# Patient Record
Sex: Male | Born: 1965
Health system: Southern US, Community
[De-identification: ages and names within clinical notes are randomized; demographics above are authoritative.]

## PROBLEM LIST (undated history)

## (undated) DIAGNOSIS — F1410 Cocaine abuse, uncomplicated: Secondary | ICD-10-CM

## (undated) DIAGNOSIS — C801 Malignant (primary) neoplasm, unspecified: Secondary | ICD-10-CM

## (undated) DIAGNOSIS — M51369 Other intervertebral disc degeneration, lumbar region without mention of lumbar back pain or lower extremity pain: Secondary | ICD-10-CM

## (undated) DIAGNOSIS — M549 Dorsalgia, unspecified: Secondary | ICD-10-CM

## (undated) DIAGNOSIS — I10 Essential (primary) hypertension: Secondary | ICD-10-CM

## (undated) DIAGNOSIS — M5136 Other intervertebral disc degeneration, lumbar region: Secondary | ICD-10-CM

## (undated) HISTORY — PX: KIDNEY SURGERY: SHX687

---

## 1998-09-05 ENCOUNTER — Emergency Department (HOSPITAL_COMMUNITY): Admission: EM | Admit: 1998-09-05 | Discharge: 1998-09-05 | Payer: Self-pay | Admitting: Emergency Medicine

## 1999-10-28 ENCOUNTER — Encounter: Payer: Self-pay | Admitting: Internal Medicine

## 1999-10-28 ENCOUNTER — Emergency Department (HOSPITAL_COMMUNITY): Admission: EM | Admit: 1999-10-28 | Discharge: 1999-10-28 | Payer: Self-pay | Admitting: *Deleted

## 2003-07-17 ENCOUNTER — Encounter: Payer: Self-pay | Admitting: Emergency Medicine

## 2003-07-18 ENCOUNTER — Encounter: Payer: Self-pay | Admitting: Internal Medicine

## 2003-07-18 ENCOUNTER — Inpatient Hospital Stay (HOSPITAL_COMMUNITY): Admission: EM | Admit: 2003-07-18 | Discharge: 2003-07-22 | Payer: Self-pay | Admitting: Emergency Medicine

## 2003-07-19 ENCOUNTER — Encounter: Payer: Self-pay | Admitting: Internal Medicine

## 2003-07-20 ENCOUNTER — Encounter: Payer: Self-pay | Admitting: Internal Medicine

## 2003-07-20 ENCOUNTER — Encounter (INDEPENDENT_AMBULATORY_CARE_PROVIDER_SITE_OTHER): Payer: Self-pay | Admitting: Cardiology

## 2005-01-22 ENCOUNTER — Emergency Department (HOSPITAL_COMMUNITY): Admission: EM | Admit: 2005-01-22 | Discharge: 2005-01-22 | Payer: Self-pay | Admitting: *Deleted

## 2005-01-28 ENCOUNTER — Ambulatory Visit (HOSPITAL_COMMUNITY): Admission: RE | Admit: 2005-01-28 | Discharge: 2005-01-28 | Payer: Self-pay | Admitting: Sports Medicine

## 2005-01-28 ENCOUNTER — Emergency Department (HOSPITAL_COMMUNITY): Admission: EM | Admit: 2005-01-28 | Discharge: 2005-01-28 | Payer: Self-pay | Admitting: Emergency Medicine

## 2005-08-06 ENCOUNTER — Emergency Department (HOSPITAL_COMMUNITY): Admission: EM | Admit: 2005-08-06 | Discharge: 2005-08-06 | Payer: Self-pay | Admitting: Emergency Medicine

## 2005-08-30 ENCOUNTER — Emergency Department (HOSPITAL_COMMUNITY): Admission: EM | Admit: 2005-08-30 | Discharge: 2005-08-30 | Payer: Self-pay | Admitting: Emergency Medicine

## 2005-10-03 ENCOUNTER — Emergency Department (HOSPITAL_COMMUNITY): Admission: EM | Admit: 2005-10-03 | Discharge: 2005-10-03 | Payer: Self-pay | Admitting: Emergency Medicine

## 2005-10-28 ENCOUNTER — Emergency Department (HOSPITAL_COMMUNITY): Admission: EM | Admit: 2005-10-28 | Discharge: 2005-10-28 | Payer: Self-pay | Admitting: Emergency Medicine

## 2006-01-19 ENCOUNTER — Emergency Department (HOSPITAL_COMMUNITY): Admission: EM | Admit: 2006-01-19 | Discharge: 2006-01-19 | Payer: Self-pay | Admitting: *Deleted

## 2006-02-07 ENCOUNTER — Emergency Department (HOSPITAL_COMMUNITY): Admission: EM | Admit: 2006-02-07 | Discharge: 2006-02-07 | Payer: Self-pay | Admitting: Emergency Medicine

## 2006-02-13 ENCOUNTER — Emergency Department (HOSPITAL_COMMUNITY): Admission: EM | Admit: 2006-02-13 | Discharge: 2006-02-13 | Payer: Self-pay | Admitting: Emergency Medicine

## 2006-09-27 ENCOUNTER — Ambulatory Visit: Payer: Self-pay | Admitting: Family Medicine

## 2007-10-03 ENCOUNTER — Emergency Department (HOSPITAL_COMMUNITY): Admission: EM | Admit: 2007-10-03 | Discharge: 2007-10-03 | Payer: Self-pay | Admitting: Emergency Medicine

## 2010-01-11 ENCOUNTER — Emergency Department (HOSPITAL_COMMUNITY): Admission: EM | Admit: 2010-01-11 | Discharge: 2010-01-11 | Payer: Self-pay | Admitting: Emergency Medicine

## 2011-02-10 NOTE — Discharge Summary (Signed)
NAME:  Marvin Mckee, Marvin Mckee                      ACCOUNT NO.:  [id number]   MEDICAL RECORD NO.:  [id number]                   PATIENT TYPE:  INP   LOCATION:  0376                                 FACILITY:  [facility]   PHYSICIAN:  Sherin Quarry, MD                   DATE OF BIRTH:  1966/02/25   DATE OF ADMISSION:  07/17/2003  DATE OF DISCHARGE:                                 DISCHARGE SUMMARY   Marvin Mckee is a 45 year old man with history of cocaine abuse but no  history of intravenous drug use, who presented to the emergency room on  October 22 with diffuse chest discomfort and a cough productive of brownish-  tinged phlegm.  The patient indicated this had been going on for about two  weeks.  He reported moderate pleuritic chest discomfort.  He had no previous  history of any significant medical problems.   Physical exam at the time of admission, as described by Hortencia Pilar, M.D.,  the patient's temperature was 102.3.  The patient was lethargic but  oriented.  HEENT:  Within normal limits. The examination of the chest was  described as clear to auscultation and percussion.  Cardiovascular exam  revealed normal S1 and S2 without rubs, murmurs, or gallops.  The white  count was 25,000.   The BMET was within normal limits.  Liver profile was normal.  Urine  metabolites were positive for both benzodiazepines and cocaine.  Chest x-ray  obtained revealed a subtle right lower lobe pneumonia.  A CT scan of the  head was negative.  Abdominal ultrasound showed evidence of gallbladder  sludge.  On admission, Dr. Soyla Dryer put the patient on oxacillin and  gentamicin, I believe because she was concerned about the possibility that  the patient might have endocarditis.  The patient was seen in consultation  by Lacretia Leigh. Ninetta Lights, M.D. of the infectious diseases service, who assisted  with the patient's management.  When it became clear that the patient was  unlikely to have endocarditis given  the fact that he had no history of  intravenous drug use and also had a normal 2 D echocardiogram, it was felt  appropriate to change his antibiotic therapy to Zithromax 250 mg daily and  Rocephin 1 g IV q.24h.  Dr. Ninetta Lights suggested that it might be prudent to  obtain a CT scan of the chest to rule out pulmonary embolus.  This was done  on October 27, and the patient had only the finding of a pulmonary  infiltrate.  By that time, the patient had received four and a half days of  antibiotics and was afebrile.  It was therefore felt reasonable to discharge  him.   DISCHARGE DIAGNOSES:  1. Right lower lobe pneumonia.  2. Cocaine abuse.   DISCHARGE MEDICATIONS:  1. Zithromax Z-pack taken as directed.  2. Ceftin 250 mg b.i.d. x 5 days.   The patient was  counseled to absolutely refrain from smoking crack cocaine  and also to try as best he could to discontinue cigarette smoking.                                               Sherin Quarry, MD    SY/MEDQ  D:  07/22/2003  T:  07/22/2003  Job:  [id number]

## 2019-02-21 ENCOUNTER — Encounter (HOSPITAL_BASED_OUTPATIENT_CLINIC_OR_DEPARTMENT_OTHER): Payer: Self-pay | Admitting: Emergency Medicine

## 2019-02-21 ENCOUNTER — Emergency Department (HOSPITAL_BASED_OUTPATIENT_CLINIC_OR_DEPARTMENT_OTHER): Payer: Self-pay

## 2019-02-21 ENCOUNTER — Other Ambulatory Visit: Payer: Self-pay

## 2019-02-21 ENCOUNTER — Observation Stay (HOSPITAL_BASED_OUTPATIENT_CLINIC_OR_DEPARTMENT_OTHER)
Admission: EM | Admit: 2019-02-21 | Discharge: 2019-02-23 | Disposition: A | Discharge status: Home / Self Care | Payer: Self-pay | Attending: Internal Medicine | Admitting: Internal Medicine

## 2019-02-21 DIAGNOSIS — Z859 Personal history of malignant neoplasm, unspecified: Secondary | ICD-10-CM | POA: Insufficient documentation

## 2019-02-21 DIAGNOSIS — F1410 Cocaine abuse, uncomplicated: Secondary | ICD-10-CM | POA: Diagnosis present

## 2019-02-21 DIAGNOSIS — Z87891 Personal history of nicotine dependence: Secondary | ICD-10-CM | POA: Insufficient documentation

## 2019-02-21 DIAGNOSIS — I639 Cerebral infarction, unspecified: Secondary | ICD-10-CM

## 2019-02-21 DIAGNOSIS — R55 Syncope and collapse: Principal | ICD-10-CM | POA: Diagnosis present

## 2019-02-21 DIAGNOSIS — I10 Essential (primary) hypertension: Secondary | ICD-10-CM | POA: Diagnosis present

## 2019-02-21 DIAGNOSIS — Z20828 Contact with and (suspected) exposure to other viral communicable diseases: Secondary | ICD-10-CM | POA: Insufficient documentation

## 2019-02-21 HISTORY — DX: Other intervertebral disc degeneration, lumbar region without mention of lumbar back pain or lower extremity pain: M51.369

## 2019-02-21 HISTORY — DX: Essential (primary) hypertension: I10

## 2019-02-21 HISTORY — DX: Dorsalgia, unspecified: M54.9

## 2019-02-21 HISTORY — DX: Cocaine abuse, uncomplicated: F14.10

## 2019-02-21 HISTORY — DX: Other intervertebral disc degeneration, lumbar region: M51.36

## 2019-02-21 HISTORY — DX: Malignant (primary) neoplasm, unspecified: C80.1

## 2019-02-21 LAB — BASIC METABOLIC PANEL
Anion gap: 7 (ref 5–15)
BUN: 16 mg/dL (ref 6–20)
CO2: 26 mmol/L (ref 22–32)
Calcium: 8.4 mg/dL — ABNORMAL LOW (ref 8.9–10.3)
Chloride: 106 mmol/L (ref 98–111)
Creatinine, Ser: 0.9 mg/dL (ref 0.61–1.24)
GFR calc Af Amer: >60 mL/min (ref >60)
GFR calc non Af Amer: >60 mL/min (ref >60)
Glucose, Bld: 109 mg/dL — ABNORMAL HIGH (ref 70–99)
Potassium: 3.5 mmol/L (ref 3.5–5.1)
Sodium: 139 mmol/L (ref 135–145)

## 2019-02-21 LAB — CBC WITH DIFFERENTIAL/PLATELET
Abs Immature Granulocytes: 0.01 10*3/uL (ref 0.00–0.07)
Basophils Absolute: 0.1 10*3/uL (ref 0.0–0.1)
Basophils Relative: 1 %
Eosinophils Absolute: 0.3 10*3/uL (ref 0.0–0.5)
Eosinophils Relative: 5 %
HCT: 41.2 % (ref 39.0–52.0)
Hemoglobin: 13.3 g/dL (ref 13.0–17.0)
Immature Granulocytes: 0 %
Lymphocytes Relative: 45 %
Lymphs Abs: 2.5 10*3/uL (ref 0.7–4.0)
MCH: 27.3 pg (ref 26.0–34.0)
MCHC: 32.3 g/dL (ref 30.0–36.0)
MCV: 84.4 fL (ref 80.0–100.0)
Monocytes Absolute: 0.6 10*3/uL (ref 0.1–1.0)
Monocytes Relative: 10 %
Neutro Abs: 2.1 10*3/uL (ref 1.7–7.7)
Neutrophils Relative %: 39 %
Platelets: 227 10*3/uL (ref 150–400)
RBC: 4.88 MIL/uL (ref 4.22–5.81)
RDW: 13.5 % (ref 11.5–15.5)
WBC: 5.5 10*3/uL (ref 4.0–10.5)
nRBC: 0 % (ref 0.0–0.2)

## 2019-02-21 LAB — RAPID URINE DRUG SCREEN, HOSP PERFORMED
Amphetamines: NOT DETECTED
Barbiturates: NOT DETECTED
Benzodiazepines: NOT DETECTED
Cocaine: POSITIVE — AB
Opiates: NOT DETECTED
Tetrahydrocannabinol: NOT DETECTED

## 2019-02-21 LAB — URINALYSIS, ROUTINE W REFLEX MICROSCOPIC
Bilirubin Urine: NEGATIVE
Glucose, UA: NEGATIVE mg/dL
Hgb urine dipstick: NEGATIVE
Ketones, ur: NEGATIVE mg/dL
Leukocytes,Ua: NEGATIVE
Nitrite: NEGATIVE
Protein, ur: NEGATIVE mg/dL
Specific Gravity, Urine: 1.025 (ref 1.005–1.030)
pH: 7 (ref 5.0–8.0)

## 2019-02-21 LAB — SARS CORONAVIRUS 2 AG (30 MIN TAT): SARS Coronavirus 2 Ag: NEGATIVE
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
## 2019-02-21 NOTE — ED Notes (Signed)
Patient transported to X-ray 

## 2019-02-21 NOTE — ED Notes (Signed)
ED TO INPATIENT HANDOFF REPORT  ED Nurse Name and Phone #: [phone]  S Name/Age/Gender Marvin Mckee 53 y.o. male Room/Bed: MH07/MH07  Code Status   Code Status: Not on file  Home/SNF/Other Dc home AO x 4   Triage Complete: Triage complete  Chief Complaint HIGH BP, DIZZY  Triage Note Pt has been having dizzy spells for about a month and has episodes of "blacking out".  Sts he used to get these episodes and found that his bp was high d/t kidney cancer.  Sts he has been off of his meds for a couple years and he is afraid his bp may be high again.  Wants to get established with pmd.     Allergies No Known Allergies  Level of Care/Admitting Diagnosis ED Disposition    ED Disposition Condition Comment   Admit  Hospital Area: [facility] [[id number]]  Level of Care: Telemetry Medical [104]  I expect the patient will be discharged within 24 hours: No (not a candidate for 5C-Observation unit)  Covid Evaluation: Screening Protocol (No Symptoms)  Diagnosis: Syncope [206001]  Admitting Physician: Ivor Costa [[id number]]  Attending Physician: Ivor Costa [[id number]]  Bed request comments: Abbott Covid 19 test negative, still need Cepheid test to r/o.  PT Class (Do Not Modify): Observation [104]  PT Acc Code (Do Not Modify): Observation [10022]       B Medical/Surgery History Past Medical History:  Diagnosis Date  . Back pain   . Cancer ([place])   . DDD (degenerative disc disease), lumbar   . Hypertension    Past Surgical History:  Procedure Laterality Date  . KIDNEY SURGERY       A IV Location/Drains/Wounds Patient Lines/Drains/Airways Status   Active Line/Drains/Airways    Name:   Placement date:   Placement time:   Site:   Days:   Peripheral IV 02/21/19 Left Antecubital   02/21/19    1751    Antecubital   less than 1          Intake/Output Last 24 hours No intake or output data in the 24 hours ending 02/21/19 2125  Labs/Imaging Results for orders  placed or performed during the hospital encounter of 02/21/19 (from the past 48 hour(s))  CBC with Differential     Status: None   Collection Time: 02/21/19  5:50 PM  Result Value Ref Range   WBC 5.5 4.0 - 10.5 K/uL   RBC 4.88 4.22 - 5.81 MIL/uL   Hemoglobin 13.3 13.0 - 17.0 g/dL   HCT 41.2 39.0 - 52.0 %   MCV 84.4 80.0 - 100.0 fL   MCH 27.3 26.0 - 34.0 pg   MCHC 32.3 30.0 - 36.0 g/dL   RDW 13.5 11.5 - 15.5 %   Platelets 227 150 - 400 K/uL   nRBC 0.0 0.0 - 0.2 %   Neutrophils Relative % 39 %   Neutro Abs 2.1 1.7 - 7.7 K/uL   Lymphocytes Relative 45 %   Lymphs Abs 2.5 0.7 - 4.0 K/uL   Monocytes Relative 10 %   Monocytes Absolute 0.6 0.1 - 1.0 K/uL   Eosinophils Relative 5 %   Eosinophils Absolute 0.3 0.0 - 0.5 K/uL   Basophils Relative 1 %   Basophils Absolute 0.1 0.0 - 0.1 K/uL   Immature Granulocytes 0 %   Abs Immature Granulocytes 0.01 0.00 - 0.07 K/uL    Comment: Performed at [facility], [address] [place], [place] [address]  Basic  metabolic panel     Status: Abnormal   Collection Time: 02/21/19  5:50 PM  Result Value Ref Range   Sodium 139 135 - 145 mmol/L   Potassium 3.5 3.5 - 5.1 mmol/L   Chloride 106 98 - 111 mmol/L   CO2 26 22 - 32 mmol/L   Glucose, Bld 109 (H) 70 - 99 mg/dL   BUN 16 6 - 20 mg/dL   Creatinine, Ser 0.90 0.61 - 1.24 mg/dL   Calcium 8.4 (L) 8.9 - 10.3 mg/dL   GFR calc non Af Amer >60 >60 mL/min   GFR calc Af Amer >60 >60 mL/min   Anion gap 7 5 - 15    Comment: Performed at [facility], [address] [place], [place] [address]  Urinalysis, Routine w reflex microscopic     Status: None   Collection Time: 02/21/19  5:50 PM  Result Value Ref Range   Color, Urine YELLOW YELLOW   APPearance CLEAR CLEAR   Specific Gravity, Urine 1.025 1.005 - 1.030   pH 7.0 5.0 - 8.0   Glucose, UA NEGATIVE NEGATIVE mg/dL   Hgb urine dipstick NEGATIVE NEGATIVE   Bilirubin Urine NEGATIVE NEGATIVE   Ketones, ur NEGATIVE NEGATIVE mg/dL    Protein, ur NEGATIVE NEGATIVE mg/dL   Nitrite NEGATIVE NEGATIVE   Leukocytes,Ua NEGATIVE NEGATIVE    Comment: Microscopic not done on urines with negative protein, blood, leukocytes, nitrite, or glucose < 500 mg/dL. Performed at [facility], [address] [place], [place] [address]   Rapid urine drug screen (hospital performed)     Status: Abnormal   Collection Time: 02/21/19  5:50 PM  Result Value Ref Range   Opiates NONE DETECTED NONE DETECTED   Cocaine POSITIVE (A) NONE DETECTED   Benzodiazepines NONE DETECTED NONE DETECTED   Amphetamines NONE DETECTED NONE DETECTED   Tetrahydrocannabinol NONE DETECTED NONE DETECTED   Barbiturates NONE DETECTED NONE DETECTED    Comment: (NOTE) DRUG SCREEN FOR MEDICAL PURPOSES ONLY.  IF CONFIRMATION IS NEEDED FOR ANY PURPOSE, NOTIFY LAB WITHIN 5 DAYS. LOWEST DETECTABLE LIMITS FOR URINE DRUG SCREEN Drug Class                     Cutoff (ng/mL) Amphetamine and metabolites    1000 Barbiturate and metabolites    200 Benzodiazepine                 270 Tricyclics and metabolites     300 Opiates and metabolites        300 Cocaine and metabolites        300 THC                            50 Performed at [facility], [address] [place], [place] [address]   SARS Coronavirus 2 (Hosp order,Performed in [place] lab via Abbott ID)     Status: None   Collection Time: 02/21/19  5:55 PM  Result Value Ref Range   SARS Coronavirus 2 (Abbott ID Now) NEGATIVE NEGATIVE    Comment: (NOTE) Interpretive Result Comment(s): COVID 19 Positive SARS CoV 2 target nucleic acids are DETECTED. The SARS CoV 2 RNA is generally detectable in upper and lower respiratory specimens during the acute phase of infection.  Positive results are indicative of active infection with SARS CoV 2.  Clinical correlation with patient history and other diagnostic information is necessary  to determine patient infection status.  Positive results do not  rule out bacterial infection or coinfection with other viruses. The expected result is Negative. COVID 19 Negative SARS CoV 2 target nucleic acids are NOT DETECTED. The SARS CoV 2 RNA is generally detectable in upper and lower respiratory specimens during the acute phase of infection.  Negative results do not preclude SARS CoV 2 infection, do not rule out coinfections with other pathogens, and should not be used as the sole basis for treatment or other patient management decisions.  Negative results must be combined with clinical  observations, patient history, and epidemiological information. The expected result is Negative. Invalid Presence or absence of SARS CoV 2 nucleic acids cannot be determined. Repeat testing was performed on the submitted specimen and repeated Invalid results were obtained.  If clinically indicated, additional testing on a new specimen with an alternate test methodology [phone]) is advised.  The SARS CoV 2 RNA is generally detectable in upper and lower respiratory specimens during the acute phase of infection. The expected result is Negative. Fact Sheet for Patients:  GolfingFamily.no Fact Sheet for Healthcare Providers: https://www.hernandez-brewer.com/ This test is not yet approved or cleared by the [place] FDA and has been authorized for detection and/or diagnosis of SARS CoV 2 by FDA under an Emergency Use Authorization (EUA).  This EUA will remain in effect (meaning this test can be used) for the duration of the COVID19 d eclaration under Section 564(b)(1) of the Act, 21 U.S.C. section [phone] 3(b)(1), unless the authorization is terminated or revoked sooner. Performed at [facility], [address] [place], [place] [address]    Dg Chest 2 View  Result Date: 02/21/2019 CLINICAL DATA:  Recurrent syncopal episodes. EXAM: CHEST - 2 VIEW COMPARISON:  Single-view of the chest 02/16/2017. FINDINGS:  Lungs clear. Heart size normal. No pneumothorax or pleural fluid. No bony abnormality. IMPRESSION: Negative chest. Electronically Signed   By: Inge Rise M.D.   On: 02/21/2019 18:14   Ct Head Wo Contrast  Result Date: 02/21/2019 CLINICAL DATA:  Syncopal episodes and headaches. Intermittent blurry vision. EXAM: CT HEAD WITHOUT CONTRAST TECHNIQUE: Contiguous axial images were obtained from the base of the skull through the vertex without intravenous contrast. COMPARISON:  Head CT 02/27/2015 FINDINGS: Brain: There is no mass, hemorrhage or extra-axial collection. There is blurring of the gray-white interface at the right occipital lobe note appears new compared to the prior study. The size and configuration of the ventricles and extra-axial CSF spaces are normal. Vascular: No abnormal hyperdensity of the major intracranial arteries or dural venous sinuses. No intracranial atherosclerosis. Skull: The visualized skull base, calvarium and extracranial soft tissues are normal. Sinuses/Orbits: No fluid levels or advanced mucosal thickening of the visualized paranasal sinuses. No mastoid or middle ear effusion. The orbits are normal. IMPRESSION: Blurring of the gray-white interface at the right occipital lobe may indicate subacute infarct. In the context of reported visual symptoms, MRI of the brain without contrast is recommended. Electronically Signed   By: Ulyses Jarred M.D.   On: 02/21/2019 18:16    Pending Labs Unresulted Labs (From admission, onward)   None      Vitals/Pain Today's Vitals   02/21/19 1800 02/21/19 1812 02/21/19 1845 02/21/19 1944  BP: (!) 133/94     Pulse: 66     Resp: 13     Temp:      TempSrc:      SpO2: 100%     Weight:  Height:      PainSc:  0-No pain 0-No pain 0-No pain    Isolation Precautions No active isolations  Medications Medications - No data to display  Mobility One assistance out of bed Medium fall risk  Focused Assessments Pt AO x 4 with  slurred speech, right side weakness on upper extremities, visual changes on right side.   R Recommendations: See Admitting Provider Note  Report given to:   Additional Notes:

## 2019-02-21 NOTE — ED Notes (Signed)
[facility] notified (Tammy) - hospitalist consult @ [facility]

## 2019-02-21 NOTE — ED Triage Notes (Signed)
Pt has been having dizzy spells for about a month and has episodes of "blacking out".  Sts he used to get these episodes and found that his bp was high d/t kidney cancer.  Sts he has been off of his meds for a couple years and he is afraid his bp may be high again.  Wants to get established with pmd.

## 2019-02-21 NOTE — ED Notes (Signed)
[facility] notified (Kim) - patient ready for transport 

## 2019-02-21 NOTE — ED Notes (Signed)
Patient has very poor peripheral vision more so on his left side.  Per patient his cousin asked him 3 days ago as why his speech is slurred and yesterday, his friend asked him the same.

## 2019-02-22 ENCOUNTER — Observation Stay (HOSPITAL_COMMUNITY): Payer: Self-pay

## 2019-02-22 ENCOUNTER — Observation Stay (HOSPITAL_BASED_OUTPATIENT_CLINIC_OR_DEPARTMENT_OTHER): Payer: Self-pay

## 2019-02-22 ENCOUNTER — Encounter (HOSPITAL_COMMUNITY): Payer: Self-pay | Admitting: Internal Medicine

## 2019-02-22 DIAGNOSIS — R55 Syncope and collapse: Secondary | ICD-10-CM

## 2019-02-22 DIAGNOSIS — F1410 Cocaine abuse, uncomplicated: Secondary | ICD-10-CM | POA: Diagnosis present

## 2019-02-22 DIAGNOSIS — I371 Nonrheumatic pulmonary valve insufficiency: Secondary | ICD-10-CM

## 2019-02-22 DIAGNOSIS — I10 Essential (primary) hypertension: Secondary | ICD-10-CM | POA: Diagnosis present

## 2019-02-22 LAB — ECHOCARDIOGRAM COMPLETE
Height: 73 in
Weight: 2895.96 oz

## 2019-02-22 LAB — SARS CORONAVIRUS 2 BY RT PCR (HOSPITAL ORDER, PERFORMED IN ~~LOC~~ HOSPITAL LAB): SARS Coronavirus 2: NEGATIVE

## 2019-02-22 LAB — GLUCOSE, CAPILLARY: Glucose-Capillary: 101 mg/dL — ABNORMAL HIGH (ref 70–99)

## 2019-02-22 MED ORDER — ENOXAPARIN SODIUM 40 MG/0.4ML ~~LOC~~ SOLN: 40.0000 mg | SUBCUTANEOUS | Status: DC

## 2019-02-22 MED ORDER — AMLODIPINE BESYLATE 10 MG PO TABS: 10.0000 mg | ORAL_TABLET | Freq: Every day | ORAL | 1 refills | Status: DC

## 2019-02-22 MED ORDER — SODIUM CHLORIDE 0.9% FLUSH
3.0000 mL | Freq: Two times a day (BID) | INTRAVENOUS | Status: DC
  Administered 2019-02-22 – 2019-02-23 (×4): 3 mL via INTRAVENOUS

## 2019-02-22 MED ORDER — LISINOPRIL 20 MG PO TABS
20.0000 mg | ORAL_TABLET | Freq: Every day | ORAL | Status: DC
  Filled 2019-02-22: qty 1

## 2019-02-22 MED ORDER — AMLODIPINE BESYLATE 10 MG PO TABS
10.0000 mg | ORAL_TABLET | Freq: Every day | ORAL | Status: DC
  Administered 2019-02-22 – 2019-02-23 (×2): 10 mg via ORAL
  Filled 2019-02-22 (×2): qty 1

## 2019-02-22 MED ORDER — GADOBUTROL 1 MMOL/ML IV SOLN
8.0000 mL | Freq: Once | INTRAVENOUS | Status: AC | PRN
  Administered 2019-02-22: 04:00:00 8 mL via INTRAVENOUS

## 2019-02-22 NOTE — Consult Note (Signed)
Neurology Consultation Reason for Consult: Syncope Referring Physician: Mora Bellman  CC: Syncope  History is obtained from: Patient  HPI: Marvin Mckee is a 53 y.o. male with a history of cancer (he states both renal and liver) who states that he has been having episodes of loss of consciousness since he came home about a month ago.  He describes the loss of consciousness primarily when he is sitting down and relatively relaxed.  He also notes that it frequently happens while driving.  He states that he is only out for "a couple of seconds" and certainly has never been out for more than a minute.  He has not had any convulsive type activity with any of these episodes.  He does note that he has had some blurred vision, but denies flashing lights or other positive visual symptoms.  LKW: 1 month ago tpa given: no, out of window.   ROS: A 14 point ROS was performed and is negative except as noted in the HPI.  Past Medical History:  Diagnosis Date  . Back pain   . Cancer ([place])   . DDD (degenerative disc disease), lumbar   . Hypertension      Family history: Multiple family members with a history of stroke   Social History:  reports that he has quit smoking. He has never used smokeless tobacco. He reports previous alcohol use. He reports previous drug use.   Exam: Current vital signs: BP (!) 151/104 (BP Location: Left Arm)   Pulse 74   Temp 98 F (36.7 C) (Oral)   Resp 18   Ht 6\' 1"  (1.854 m)   Wt 82.1 kg   SpO2 100%   BMI 23.88 kg/m  Vital signs in last 24 hours: Temp:  [98 F (36.7 C)-98.2 F (36.8 C)] 98 F (36.7 C) (05/29 2314) Pulse Rate:  [56-74] 74 (05/29 2314) Resp:  [13-21] 18 (05/29 2314) BP: (130-151)/(82-104) 151/104 (05/29 2314) SpO2:  [99 %-100 %] 100 % (05/29 2314) Weight:  [82.1 kg-83.6 kg] 82.1 kg (05/29 2314)   Physical Exam  Constitutional: Appears well-developed and well-nourished.  Psych: Affect appropriate to situation Eyes: No scleral  injection HENT: No OP obstrucion Head: Normocephalic.  Cardiovascular: Normal rate and regular rhythm.  Respiratory: Effort normal, non-labored breathing GI: Soft.  No distension. There is no tenderness.  Skin: WDI  Neuro: Mental Status: Patient is awake, alert, oriented to person, place, month, year, and situation. Patient is able to give a clear and coherent history. No signs of aphasia or neglect Cranial Nerves: II: Visual Fields are full. Pupils are equal, round, and reactive to light.   III,IV, VI: EOMI without ptosis or diploplia.  V: Facial sensation is symmetric to temperature VII: Facial movement is symmetric.  VIII: hearing is intact to voice X: Uvula elevates symmetrically XI: Shoulder shrug is symmetric. XII: tongue is midline without atrophy or fasciculations.  Motor: Tone is normal. Bulk is normal. 5/5 strength was present on the right, he has mild weakness of the left arm and leg with orbital sign.  Sensory: Sensation is symmetric to light touch and temperature in the arms and legs. Cerebellar: No ataxia.     I have reviewed labs in epic and the results pertinent to this consultation are: BMP-unremarkable other than mild hypercalcemia  I have reviewed the images obtained: CT head - ?  Lesion in the right occipital region  Impression: 52 year old male with recurrent brief episodes of syncope.  The description of was occurring when  he is sitting down and relaxed or driving may be suspicious for sleep attacks.  I am uncertain of the significance of the head CT findings, but MRI will be needed to further characterize this lesion, possibilities include subacute stroke, benign lesion, metastatic cancer.  Recommendations: 1) EEG 2) MRI brain with and without 3) I advised the patient that he is not allowed to drive 4) neurology will follow   Roland Rack, MD [facility] [phone]  If 7pm- 7am, please page neurology on call as listed in  [place].

## 2019-02-22 NOTE — Progress Notes (Signed)
EEG complete - results pending 

## 2019-02-22 NOTE — Procedures (Signed)
  [place] A. Merlene Laughter, MD     [email or web address]           HISTORY: This is a 53 year old who presents with recurrent episodes of syncope concerning for seizures.  MEDICATIONS:  Current Facility-Administered Medications:  .  amLODipine (NORVASC) tablet 10 mg, 10 mg, Oral, Daily, Caren Griffins, MD, 10 mg at 02/22/19 0907 .  [START ON 02/23/2019] enoxaparin (LOVENOX) injection 40 mg, 40 mg, Subcutaneous, Q24H, Gardner, Jared M, DO .  sodium chloride flush (NS) 0.9 % injection 3 mL, 3 mL, Intravenous, Q12H, Alcario Drought, Jared M, DO, 3 mL at 02/22/19 0810     ANALYSIS: A 16 channel recording using standard 10 20 measurements is conducted for 24 minutes.  The background posterior activity is mostly that of a theta 18 to 20 Hz activity.  There is however a more typical 11 to 12 Hz activity also seen throughout the recording's posteriorly.  The recording transitions to generalized theta activity followed by vertex sharp waves and some spindles documented stage II non-REM sleep.  Photic stimulation and hyperventilation are not conducted.  There is no focal or lateral slowing.  There is no epileptiform activity is observed.   IMPRESSION: 1.  This is a normal recording of awake and asleep states.      Gabrella Stroh A. Merlene Laughter, M.D.  Diplomate, Tax adviser of Psychiatry and Neurology ( Neurology).

## 2019-02-22 NOTE — Progress Notes (Signed)
Patient admitted from [facility] for stroke evaluation. A/O x4 on arrival. Rapid covid resulted neg. MAE, cardiac monitor placed and CCMD notified. Made comfortable in bed and BP (!) 151/104 (BP Location: Left Arm)   Pulse 74   Temp 98 F (36.7 C) (Oral)   Resp 18   Ht 6\' 1"  (1.854 m)   Wt 82.1 kg   SpO2 100%   BMI 23.88 kg/m
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
## 2019-02-22 NOTE — Progress Notes (Signed)
EEG reviewed. No definite electrographic seizures seen. Possible depolarizations consistent with interictal discharges are seen, but are of equivocal significance, also possibly representing artifact. Awaiting official interpretation.   Electronically signed: Dr. Kerney Elbe

## 2019-02-22 NOTE — Progress Notes (Signed)
  Echocardiogram 2D Echocardiogram has been performed.  Matilde Bash 02/22/2019, 10:46 AM
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
## 2019-02-23 DIAGNOSIS — I10 Essential (primary) hypertension: Secondary | ICD-10-CM

## 2019-02-23 DIAGNOSIS — F1410 Cocaine abuse, uncomplicated: Secondary | ICD-10-CM

## 2019-02-23 LAB — GLUCOSE, CAPILLARY: Glucose-Capillary: 101 mg/dL — ABNORMAL HIGH (ref 70–99)

## 2019-02-23 LAB — HIV ANTIBODY (ROUTINE TESTING W REFLEX): HIV Screen 4th Generation wRfx: NONREACTIVE

## 2019-02-23 NOTE — Progress Notes (Signed)
NURSING PROGRESS NOTE  Marvin Mckee [id number] Discharge Data: 02/23/2019 2:58 PM Attending Provider: Marcell Anger* [id number], No Pcp Per     [name] to be D/C'd Home per MD order.  Discussed with the patient the After Visit Summary and all questions fully answered. All IV's discontinued with no bleeding noted. All belongings returned to patient for patient to take home.   Last Vital Signs:  Blood pressure (!) 127/93, pulse 72, temperature 98.5 F (36.9 C), temperature source Oral, resp. rate 16, height 6\' 1"  (1.854 m), weight 83.8 kg, SpO2 100 %.  Discharge Medication List Allergies as of 02/23/2019   No Known Allergies     Medication List    TAKE these medications   amLODipine 10 MG tablet Commonly known as:  NORVASC Take 1 tablet (10 mg total) by mouth daily.
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
## 2019-02-23 NOTE — Care Management (Signed)
Patient to call and schedule follow up at [facility], on [facility]. meds are on $4 list at [facility]
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
## 2019-03-03 ENCOUNTER — Other Ambulatory Visit: Payer: Self-pay

## 2019-03-03 ENCOUNTER — Encounter: Payer: Self-pay | Admitting: Family Medicine

## 2019-03-03 ENCOUNTER — Ambulatory Visit: Payer: Self-pay | Attending: Family Medicine | Admitting: Family Medicine

## 2019-03-03 DIAGNOSIS — R55 Syncope and collapse: Secondary | ICD-10-CM

## 2019-03-03 DIAGNOSIS — I10 Essential (primary) hypertension: Secondary | ICD-10-CM
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
## 2019-03-03 NOTE — Progress Notes (Signed)
Patient has been called and DOB has been verified. Patient has been screened and transferred to PCP to start phone visit.     

## 2019-03-04 ENCOUNTER — Telehealth (INDEPENDENT_AMBULATORY_CARE_PROVIDER_SITE_OTHER): Payer: Self-pay | Admitting: Physician Assistant

## 2019-03-04 ENCOUNTER — Telehealth: Payer: Self-pay | Admitting: Radiology

## 2019-03-04 ENCOUNTER — Encounter: Payer: Self-pay | Admitting: Physician Assistant

## 2019-03-04 VITALS — BP 162/80 | HR 71 | Ht 73.0 in | Wt 185.0 lb

## 2019-03-04 DIAGNOSIS — I10 Essential (primary) hypertension: Secondary | ICD-10-CM

## 2019-03-04 DIAGNOSIS — F1410 Cocaine abuse, uncomplicated: Secondary | ICD-10-CM

## 2019-03-04 DIAGNOSIS — R55 Syncope and collapse: Secondary | ICD-10-CM

## 2019-03-04 DIAGNOSIS — Z7189 Other specified counseling: Secondary | ICD-10-CM

## 2019-03-04 NOTE — Telephone Encounter (Signed)
Enrolled patient for a 30 Day [employer] event monitor to be mailed. Brief instructions were gone over with the patient and he know to expect the monitor to arrive in 3-4 days.  *patient is currently self pay and has been instructed to talk [employer] about his options.

## 2019-03-04 NOTE — Patient Instructions (Addendum)
Medication Instructions:  Your physician recommends that you continue on your current medications as directed. Please refer to the Current Medication list given to you today.  If you need a refill on your cardiac medications before your next appointment, please call your pharmacy.   Lab work: NONE ORDERED  TODAY   If you have labs (blood work) drawn today and your tests are completely normal, you will receive your results only by: Marland Kitchen MyChart Message (if you have MyChart) OR . A paper copy in the mail If you have any lab test that is abnormal or we need to change your treatment, we will call you to review the results.  Testing/Procedures: Your physician has recommended that you wear an event monitor. Event monitors are medical devices that record the heart's electrical activity. Doctors most often [place] these monitors to diagnose arrhythmias. Arrhythmias are problems with the speed or rhythm of the heartbeat. The monitor is a small, portable device. You can wear one while you do your normal daily activities. This is usually used to diagnose what is causing palpitations/syncope (passing out).   Follow-Up: in 6 to 8 weeks with Richardson Dopp PA virtual visit   Any Other Special Instructions Will Be Listed Below (If Applicable). Check BP 2 times a day for 2 weeks and call with readings

## 2019-03-13 ENCOUNTER — Telehealth: Payer: Self-pay | Admitting: Family Medicine

## 2019-03-13 ENCOUNTER — Ambulatory Visit (INDEPENDENT_AMBULATORY_CARE_PROVIDER_SITE_OTHER): Payer: Self-pay

## 2019-03-13 DIAGNOSIS — R55 Syncope and collapse: Secondary | ICD-10-CM

## 2019-03-13 NOTE — Telephone Encounter (Signed)
Pt called again and would like to know about aftercare and would like to check up on his status regarding his 3 months leave of absence for his employer...please follow up

## 2019-03-13 NOTE — Telephone Encounter (Signed)
Pt called asking for note that states that he should be out of work for 3 months...please follow up alyc

## 2019-03-15 ENCOUNTER — Telehealth: Payer: Self-pay | Admitting: Cardiology

## 2019-03-15 NOTE — Telephone Encounter (Signed)
Received a page from [employer] monitoring regarding a critical ECG. Patient signaled an episode of syncope. Monitor at that time showed sinus rhythm at a rate of 65 bpm. No arrhythmia associated with syncope. No injury sustained. Patient was advised to seek care in the ED by [employer] and he declined. I spoke with him as well and he notes this was a typical episode of syncope. He is feeling well now and does not wish to come to the emergency room. He understood to call or seek medical attention should he develop new symptoms.  Bryna Colander, MD

## 2019-03-17 ENCOUNTER — Telehealth: Payer: Self-pay | Admitting: Physician Assistant

## 2019-03-17 ENCOUNTER — Telehealth: Payer: Self-pay | Admitting: *Deleted

## 2019-03-17 ENCOUNTER — Ambulatory Visit: Payer: Self-pay | Attending: Family Medicine

## 2019-03-17 ENCOUNTER — Other Ambulatory Visit: Payer: Self-pay

## 2019-03-17 ENCOUNTER — Telehealth: Payer: Self-pay | Admitting: Family Medicine

## 2019-03-17 NOTE — Telephone Encounter (Signed)
Discussed with Richardson Dopp PAC NSR continue to monitor  ./cy

## 2019-03-17 NOTE — Telephone Encounter (Signed)
Patient was called and informed of paperwork being ready for pick up. Paperwork was faxed over to number provided.
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
## 2019-03-17 NOTE — Telephone Encounter (Signed)
Received critical recording from [employer] Pt manually pushed record an hour later after briefly  passing out findings Sinus Rhythm.Per pt feels fine today./cy

## 2019-03-17 NOTE — Telephone Encounter (Signed)
Patient was calling trying to get in touch with his nurse, please follow up

## 2019-03-17 NOTE — Telephone Encounter (Signed)
Pt was called since he had a TELE visit for financial, Pt was informed that he was missing a copy of ID, 2019 taxes (1040Forms), 3 month bank statement, he need to have it before 03/27/2019, Pt understood
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
## 2019-03-18 NOTE — Telephone Encounter (Signed)
There was another episode of syncope reported yesterday afternoon.  Tele demonstrated sinus rhythm. No evidence of cardiac syncope thus far.  Continue to monitor. Richardson Dopp, PA-C    03/18/2019 9:53 AM

## 2019-03-25 ENCOUNTER — Encounter: Payer: Self-pay | Admitting: Cardiovascular Disease

## 2019-03-26 ENCOUNTER — Telehealth: Payer: Self-pay

## 2019-03-26 ENCOUNTER — Encounter: Payer: Self-pay | Admitting: Cardiovascular Disease

## 2019-03-26 NOTE — Telephone Encounter (Addendum)
Received monitor report from [employer], DAY 13, SERIOUS It is from 03/25/19 at 12:21 am CST  This was auto triggered, no symptoms reports Report Analysis: SR w/ PSVT/PACs, HR 161.  Reviewed by Richardson Dopp, PA-C "brief episode of SVT last approx 12 sec. I left message for pt to call back to review if he recalls sensing this.

## 2019-03-26 NOTE — Telephone Encounter (Signed)
Continue to monitor. Richardson Dopp, PA-C    03/26/2019 12:45 PM

## 2019-03-26 NOTE — Telephone Encounter (Signed)
Received a call from [place] spoke to [facility] she reported patient pressed monitor to record he had a syncopal episode this morning while sitting on side of bed.Monitor revealed sinus rhythm rate 64 to 66.Patient refused to go to ED.Spoke to patient he stated he is feeling good.Stated he passed out while sitting on side of bed this morning.Stated he did not want to go to ED.Stated he has 2 more weeks to wear monitor.

## 2019-03-26 NOTE — Telephone Encounter (Signed)
Received Critical monitor report from [employer] from this am 03/26/19 -Day 14 at 9:06 am Pt recorded that he passed out.   Rhythm as was reported from [employer]: SR HR 64.

## 2019-04-15 ENCOUNTER — Other Ambulatory Visit: Payer: Self-pay | Admitting: Physician Assistant

## 2019-04-15 DIAGNOSIS — R55 Syncope and collapse: Secondary | ICD-10-CM
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
## 2019-04-30 ENCOUNTER — Telehealth: Payer: Self-pay | Admitting: Family Medicine

## 2019-04-30 ENCOUNTER — Telehealth: Payer: Self-pay | Admitting: *Deleted

## 2019-04-30 ENCOUNTER — Other Ambulatory Visit: Payer: Self-pay

## 2019-04-30 ENCOUNTER — Ambulatory Visit (INDEPENDENT_AMBULATORY_CARE_PROVIDER_SITE_OTHER): Payer: Self-pay | Admitting: Physician Assistant

## 2019-04-30 ENCOUNTER — Encounter: Payer: Self-pay | Admitting: Physician Assistant

## 2019-04-30 VITALS — BP 118/78 | HR 78 | Ht 72.0 in | Wt 161.0 lb

## 2019-04-30 DIAGNOSIS — R55 Syncope and collapse: Secondary | ICD-10-CM

## 2019-04-30 DIAGNOSIS — I10 Essential (primary) hypertension: Secondary | ICD-10-CM

## 2019-04-30 NOTE — Telephone Encounter (Signed)
Patient was called and informed that he will need to re apply for CAFA due to missing documents.

## 2019-04-30 NOTE — Telephone Encounter (Signed)
S/w Shakelle at [employer] and wellness @ [phone] whom is going to leave a message for the nurse in regards to pt's sleep study and will get in contact with pt.

## 2019-04-30 NOTE — Patient Instructions (Signed)
Medication Instructions:  Your physician recommends that you continue on your current medications as directed. Please refer to the Current Medication list given to you today.   Labwork: -None  Testing/Procedures: -None  Follow-Up: Your physician recommends that you keep your scheduled  follow-up appointment with Richardson Dopp, [place] in August 2021.   Any Other Special Instructions Will Be Listed Below (If Applicable).  [facility] was called today regarding your sleep study.  A nurse will call you back from this office.    If you need a refill on your cardiac medications before your next appointment, please call your pharmacy.

## 2019-04-30 NOTE — Telephone Encounter (Signed)
Marvin Mckee with [facility] called on the behalf of the patient in regards to the sleep study.States patient is concerned no one has followed up in regards to the sleep study. Please follow up.

## 2019-05-30 ENCOUNTER — Telehealth: Payer: Self-pay | Admitting: Family Medicine

## 2019-05-30 NOTE — Telephone Encounter (Signed)
Patient called back wanting to know when he should come back for a FU appt. Please follow up.

## 2019-06-03 NOTE — Telephone Encounter (Signed)
Patient was called and a voicemail states that his phone is broken and to call another number. Patient was called on other number and no one answered. Patient has been set up with an appointment.

## 2019-06-25 ENCOUNTER — Telehealth: Payer: Self-pay | Admitting: Physician Assistant

## 2019-06-25 NOTE — Telephone Encounter (Signed)
Signed FMLA form returned to [employer] [place] at [place]. 06/25/19 vlm

## 2019-06-26 ENCOUNTER — Telehealth: Payer: Self-pay | Admitting: Family Medicine

## 2019-06-26 NOTE — Telephone Encounter (Signed)
Patient was called to get more information on paperwork needed. Patient was informed to have company fax ove paperwork. Patient then got silient and the phone disconnected.  Patient was called back and their was no answer.

## 2019-06-26 NOTE — Telephone Encounter (Signed)
Patient called stating that [employer] is requesting a form update for his disability/FMLA. Please follow up.

## 2019-07-02 ENCOUNTER — Ambulatory Visit: Payer: Self-pay | Attending: Family Medicine | Admitting: Family Medicine

## 2019-07-02 ENCOUNTER — Encounter: Payer: Self-pay | Admitting: Family Medicine

## 2019-07-02 ENCOUNTER — Other Ambulatory Visit: Payer: Self-pay

## 2019-07-02 VITALS — BP 147/87 | HR 86 | Temp 98.2°F | Ht 72.0 in | Wt 162.0 lb

## 2019-07-02 DIAGNOSIS — R400 Somnolence: Secondary | ICD-10-CM

## 2019-07-02 DIAGNOSIS — I10 Essential (primary) hypertension: Secondary | ICD-10-CM

## 2019-07-02 DIAGNOSIS — R399 Unspecified symptoms and signs involving the genitourinary system: Secondary | ICD-10-CM

## 2019-07-02 DIAGNOSIS — R55 Syncope and collapse: Secondary | ICD-10-CM

## 2019-07-02 LAB — POCT URINALYSIS DIP (CLINITEK)
Blood, UA: NEGATIVE
Glucose, UA: NEGATIVE mg/dL
Ketones, POC UA: NEGATIVE mg/dL
Leukocytes, UA: NEGATIVE
Nitrite, UA: NEGATIVE
POC PROTEIN,UA: 30 — AB
Spec Grav, UA: >=1.03 — AB (ref 1.010–1.025)
Urobilinogen, UA: 1 E.U./dL
pH, UA: 6 (ref 5.0–8.0)

## 2019-07-02 MED ORDER — TAMSULOSIN HCL 0.4 MG PO CAPS: 0.4000 mg | ORAL_CAPSULE | Freq: Every day | ORAL | 3 refills | Status: DC

## 2019-07-02 MED ORDER — AMLODIPINE BESYLATE 10 MG PO TABS: 10.0000 mg | ORAL_TABLET | Freq: Every day | ORAL | 3 refills | Status: DC

## 2019-07-02 NOTE — Progress Notes (Signed)
Patient would like to be released back to work.  Patient states that he has burning while urinating.

## 2019-07-02 NOTE — Patient Instructions (Signed)

## 2019-07-02 NOTE — Progress Notes (Signed)
Subjective:  Patient ID: Marvin Mckee, male    DOB: September 11, 1966  Age: 53 y.o. MRN: [id number]  CC: Hypertension   HPI Marvin Mckee is a 53 year old male with a history of Hypertension, recurrent syncope resulting in care creashes here for a follow up visit Work up so far has been unrevealing and he informed me Cardiology cleared him to return to work but I am unable to locate  form completed by Cardiology. He states he is no longer being paid by his job due to the Cardiology note. Notes from Cardiology exclude Cardiac etiology of syncope.  Plan was to refer him for a sleep study but he has no medical coverage and has not applied for the [employer] discount. 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 Drinking coke keeps him awake during the day as he has found himself nodding off while in the passenger seat.  He complains of urinary dribbling, dysuria for the last 2-3 week. Denies presence of hesitancy. He has changed sexual partners recently and does not use protection. Blood pressure is elevated and he has not been compliant with his antihypertensive.  Past Medical History:  Diagnosis Date  . Back pain   . Cancer ([place])   . Cocaine abuse ([place])   . DDD (degenerative disc disease), lumbar   . Hypertension     Past Surgical History:  Procedure Laterality Date  . KIDNEY SURGERY      History reviewed. No pertinent family history.  No Known Allergies  Outpatient Medications Prior to Visit  Medication Sig Dispense Refill  . amLODipine (NORVASC) 10 MG tablet Take 1 tablet (10 mg total) by mouth daily. 30 tablet 1   No facility-administered medications prior to visit.      ROS Review of Systems  Constitutional: Negative for activity change and appetite change.  HENT: Negative for sinus pressure and sore throat.   Eyes: Negative for visual disturbance.  Respiratory: Negative for cough, chest tightness  and shortness of breath.   Cardiovascular: Negative for chest pain and leg swelling.  Gastrointestinal: Negative for abdominal distention, abdominal pain, constipation and diarrhea.  Endocrine: Negative.   Genitourinary: Negative for dysuria.  Musculoskeletal: Negative for joint swelling and myalgias.  Skin: Negative for rash.  Allergic/Immunologic: Negative.   Neurological: Positive for syncope. Negative for weakness, light-headedness and numbness.  Psychiatric/Behavioral: Positive for sleep disturbance. Negative for dysphoric mood and suicidal ideas.    Objective:  BP (!) 147/87   Pulse 86   Temp 98.2 F (36.8 C) (Oral)   Ht 6' (1.829 m)   Wt 162 lb (73.5 kg)   SpO2 98%   BMI 21.97 kg/m   BP/Weight 07/02/2019 [id number]  Systolic BP Q000111Q [id number]  Diastolic BP 87 78 80  Wt. (Lbs) 162 161 185  BMI 21.97 21.84 24.41      Physical Exam Constitutional:      Appearance: He is well-developed.  Neck:     Vascular: No JVD.  Cardiovascular:     Rate and Rhythm: Normal rate.     Heart sounds: Normal heart sounds. No murmur.  Pulmonary:     Effort: Pulmonary effort is normal.     Breath sounds: Normal breath sounds. No wheezing or rales.  Chest:     Chest wall: No tenderness.  Abdominal:     General: Bowel sounds are normal. There is no distension.  Palpations: Abdomen is soft. There is no mass.     Tenderness: There is no abdominal tenderness.  Musculoskeletal: Normal range of motion.     Right lower leg: No edema.     Left lower leg: No edema.  Neurological:     Mental Status: He is alert and oriented to person, place, and time.  Psychiatric:        Mood and Affect: Mood normal.     CMP Latest Ref Rng & Units 02/21/2019  Glucose 70 - 99 mg/dL 109(H)  BUN 6 - 20 mg/dL 16  Creatinine 0.61 - 1.24 mg/dL 0.90  Sodium 135 - 145 mmol/L 139  Potassium 3.5 - 5.1 mmol/L 3.5  Chloride 98 - 111 mmol/L 106  CO2 22 - 32 mmol/L 26  Calcium 8.9 - 10.3 mg/dL 8.4(L)     Lipid Panel  No results found for: CHOL, TRIG, HDL, CHOLHDL, VLDL, LDLCALC, LDLDIRECT  CBC    Component Value Date/Time   WBC 5.5 02/21/2019 1750   RBC 4.88 02/21/2019 1750   HGB 13.3 02/21/2019 1750   HCT 41.2 02/21/2019 1750   PLT 227 02/21/2019 1750   MCV 84.4 02/21/2019 1750   MCH 27.3 02/21/2019 1750   MCHC 32.3 02/21/2019 1750   RDW 13.5 02/21/2019 1750   LYMPHSABS 2.5 02/21/2019 1750   MONOABS 0.6 02/21/2019 1750   EOSABS 0.3 02/21/2019 1750   BASOSABS 0.1 02/21/2019 1750    No results found for: HGBA1C  Assessment & Plan:   1. Lower urinary tract symptoms Will screen for STDs and also commence Flomax for possible BPH - POCT URINALYSIS DIP (CLINITEK) - Urine cytology ancillary only - tamsulosin (FLOMAX) 0.4 MG CAPS capsule; Take 1 capsule (0.4 mg total) by mouth daily.  Dispense: 30 capsule; Refill: 3  2. Recurrent syncope Likely secondary to daytime somnolence Narcolepsy needs to be ruled out as well Previous EEG unrevealing Advised aganst driving - Ambulatory referral to Neurology  3. 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 Essential hypertension Uncontrolled Compliance with medications emphasized Counseled on blood pressure goal of less than 130/80, low-sodium, DASH diet, medication compliance, 150 minutes of moderate intensity exercise per week. Discussed medication compliance, adverse effects. - amLODipine (NORVASC) 10 MG tablet; Take 1 tablet (10 mg total) by mouth daily.  Dispense: 30 tablet; Refill: 3    Meds ordered this encounter  Medications  . tamsulosin (FLOMAX) 0.4 MG  CAPS capsule    Sig: Take 1 capsule (0.4 mg total) by mouth daily.    Dispense:  30 capsule    Refill:  3  . DISCONTD: amLODipine (NORVASC) 10 MG tablet    Sig: Take 1 tablet (10 mg total) by mouth daily.    Dispense:  30 tablet    Refill:  3  . amLODipine (NORVASC) 10 MG tablet    Sig: Take 1 tablet (10 mg total) by mouth daily.    Dispense:  30 tablet    Refill:  3    Follow-up: Return in about 3 months (around 10/02/2019) for Chronic medical conditions.       Charlott Rakes, MD, FAAFP. [place]  [facility] [place], [place]   07/02/2019, 10:33 AM
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
## 2019-07-10 LAB — URINE CYTOLOGY ANCILLARY ONLY
Chlamydia: NEGATIVE
Comment: NEGATIVE
Comment: NEGATIVE
Comment: NORMAL
Neisseria Gonorrhea: NEGATIVE
Trichomonas: NEGATIVE

## 2019-07-11 ENCOUNTER — Other Ambulatory Visit: Payer: Self-pay

## 2019-07-11 ENCOUNTER — Encounter: Payer: Self-pay | Admitting: Neurology

## 2019-07-11 ENCOUNTER — Ambulatory Visit (INDEPENDENT_AMBULATORY_CARE_PROVIDER_SITE_OTHER): Payer: Self-pay | Admitting: Neurology

## 2019-07-11 VITALS — BP 105/74 | HR 76 | Temp 97.0°F | Ht 73.0 in | Wt 163.0 lb

## 2019-07-11 DIAGNOSIS — R400 Somnolence: Secondary | ICD-10-CM

## 2019-07-11 DIAGNOSIS — R55 Syncope and collapse: Secondary | ICD-10-CM

## 2019-07-11 NOTE — Patient Instructions (Signed)
1.  Cancel your sleep study.  I want to refer you to [facility] to see one of their sleep specialists.  They will then order the most appropriate sleep study.

## 2019-07-17 ENCOUNTER — Telehealth: Payer: Self-pay | Admitting: Family Medicine

## 2019-07-17 NOTE — Telephone Encounter (Signed)
Sleep Study Cntr called in regards to pt stating he is wanting to be seen at another location and they don't know exactly which one, please follow up w/ pt when possible

## 2019-07-17 NOTE — Telephone Encounter (Signed)
Sleep center was called and informed to cancel appointment for sleep study due to patient getting sleep study through his neurologist office. Patient has been informed of appointment being canceled. Patient has new sleep study appointment on 08/06/2019

## 2019-07-18 ENCOUNTER — Telehealth: Payer: Self-pay

## 2019-07-18 NOTE — Telephone Encounter (Signed)
Patient was called and a voicemail was left informing patient to return phone call for lab results. 

## 2019-07-18 NOTE — Telephone Encounter (Signed)
-----   Message from Charlott Rakes, MD sent at 07/10/2019  1:10 PM EDT ----- Urine cytology is negative for STDs

## 2019-07-21 ENCOUNTER — Other Ambulatory Visit (HOSPITAL_COMMUNITY): Payer: Self-pay

## 2019-07-24 ENCOUNTER — Telehealth: Payer: Self-pay

## 2019-07-24 ENCOUNTER — Encounter (HOSPITAL_BASED_OUTPATIENT_CLINIC_OR_DEPARTMENT_OTHER): Payer: Self-pay | Admitting: Internal Medicine

## 2019-07-24 NOTE — Telephone Encounter (Signed)
-----   Message from Charlott Rakes, MD sent at 07/10/2019  1:10 PM EDT ----- Urine cytology is negative for STDs

## 2019-07-24 NOTE — Telephone Encounter (Signed)
Patient name and DOB has been verified Patient was informed of lab results. Patient had no questions.  

## 2019-08-06 ENCOUNTER — Other Ambulatory Visit: Payer: Self-pay

## 2019-08-06 ENCOUNTER — Encounter: Payer: Self-pay | Admitting: Neurology

## 2019-08-06 ENCOUNTER — Ambulatory Visit: Payer: Self-pay | Admitting: Neurology

## 2019-08-06 VITALS — BP 124/72 | HR 74 | Temp 97.4°F | Ht 73.0 in | Wt 159.0 lb

## 2019-08-06 DIAGNOSIS — F1410 Cocaine abuse, uncomplicated: Secondary | ICD-10-CM

## 2019-08-06 DIAGNOSIS — G4710 Hypersomnia, unspecified: Secondary | ICD-10-CM | POA: Insufficient documentation

## 2019-08-06 DIAGNOSIS — I10 Essential (primary) hypertension: Secondary | ICD-10-CM

## 2019-08-06 DIAGNOSIS — R55 Syncope and collapse: Secondary | ICD-10-CM
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
## 2019-08-06 NOTE — Patient Instructions (Signed)

## 2019-08-13 ENCOUNTER — Telehealth: Payer: Self-pay | Admitting: Neurology

## 2019-08-13 DIAGNOSIS — R55 Syncope and collapse: Secondary | ICD-10-CM

## 2019-08-13 DIAGNOSIS — G4710 Hypersomnia, unspecified: Secondary | ICD-10-CM

## 2019-08-13 DIAGNOSIS — I10 Essential (primary) hypertension: Secondary | ICD-10-CM

## 2019-08-13 DIAGNOSIS — F1410 Cocaine abuse, uncomplicated: Secondary | ICD-10-CM

## 2019-08-13 DIAGNOSIS — R400 Somnolence: Secondary | ICD-10-CM

## 2019-08-13 LAB — NARCOLEPSY EVALUATION
DQA1*01:02: POSITIVE
DQB1*06:02: POSITIVE

## 2019-08-13 NOTE — Telephone Encounter (Signed)
-----   Message from Larey Seat, MD sent at 08/13/2019  2:40 PM EST ----- Bot alleles positive for narolepsy.

## 2019-08-13 NOTE — Telephone Encounter (Signed)
Called the pt and reviewed the Lab results with the patient. Advised the patient that he tested positive in both strands for the narcolepsy Gene. Advised the patient that Dr. Brett Fairy would recommend the pt complete overnight study and MSLT. Advised the patient that an order would be placed for the pt and explained that we will plan to proceed with both studies but if the over night study suggest that sleep apnea is present they will not complete the daytime study because they require that we treat the sleep apnea first. Advised the patient that certain medication can affect the results and cause false readings in the test. Reviewed the medication list and it is current and up to date. Given the patient's history of substance use I reviewed and educated that substances such as marijuana, cocaine, pills can cause the test to be invalid. Advised the patient that a urine drug screen is performed and if these show in system and invalidated the test. Patient verbalized understanding and would like to proceed with going forward. At this time the patient is not covered with insurance. Advised that someone would be in contact with him to schedule for the sleep studies. He states he will have insurance at the first of the year. I advised he can choose to complete the sleep study at the price offered to him when the call to schedule or if he prefers to wait until the insurance kicks in that would also be fine. Patient was waiting for a call from the sleep lab. I advised that our MD had not ordered the test because she was waiting to see what this lab showed before knowing what to order. Patient verbalized understanding and will wait for the sleep lab to call.

## 2019-08-14 ENCOUNTER — Telehealth: Payer: Self-pay

## 2019-08-14 NOTE — Progress Notes (Unsigned)
Addendum: 08-14-2019   Mr Echavarria HLA test for narcolepsy has returned positive on 2 alleles. He reported persistent hypersomnia and will be covered by insurance in the coming calendar year.  I will pursue a PSG and MSLT now, given these new results.  He is not on medication that would interfere with REM sleep latency , but has to  Be abstinent from all use of stimulating or sedating recreational substances, such as cocaine in the interval.   The studies will be arranged through [facility] .   Larey Seat, MD

## 2019-08-14 NOTE — Telephone Encounter (Signed)
I need you to addend your office note with this patient. Even though he does not have insurance at this time, he will in Jan 2021. The addendum needs to match with the HLA test and the next steps of testing. We need your note to correlate for [place] to proceed with the NPSG and MSLT. Thanks!!

## 2019-08-18 ENCOUNTER — Telehealth: Payer: Self-pay

## 2019-08-18 NOTE — Telephone Encounter (Signed)
Called pt to discuss scheduling the NPSG and MSLT. At this time, pt is out of work and cannot afford both of these sleep studies-even with the 55% discount. ([id number] and [id number]) Pt is asking to wait to schedule in the new year when he is covered under an insurance plan.

## 2019-08-18 NOTE — Telephone Encounter (Signed)
Sleep lab has called pt to find out if he is interested in scheduling now to pay cash for sleep studies or wait til New Year when covered by insurance. Gave my direct number to reach me back. Will wait for return call back.

## 2019-08-20 NOTE — Telephone Encounter (Signed)
I wrote an addendum - did you see it ? CD

## 2019-10-16 ENCOUNTER — Telehealth: Payer: Self-pay | Admitting: Family Medicine

## 2019-10-16 NOTE — Telephone Encounter (Signed)
Patient called saying that his insurance company is requesting an updated letter on why the patient is unable to work. Patient would like for the letter that was written in October be faxed to his insurance company. Please f/u    Fax: [phone] Attn: [place]: Leonard Downing

## 2019-10-16 NOTE — Telephone Encounter (Signed)
Patient was called and a voicemail was left informing patient to obtain letter from his neurology office.

## 2019-10-20 ENCOUNTER — Ambulatory Visit: Payer: 59 | Attending: Family Medicine | Admitting: Family Medicine

## 2019-10-20 ENCOUNTER — Other Ambulatory Visit: Payer: Self-pay

## 2019-10-24 ENCOUNTER — Telehealth: Payer: Self-pay | Admitting: Neurology

## 2019-10-24 ENCOUNTER — Telehealth: Payer: Self-pay

## 2019-10-24 NOTE — Telephone Encounter (Signed)
I had referred him to Dr. Westley Hummer at [facility].  She was the last physician to evaluate him.  I never determined a diagnosis or personally worked him up, therefore, it is not appropriate for me to fill out his work note.

## 2019-10-24 NOTE — Telephone Encounter (Signed)
Left message this cant be done here, must be done by [facility], will mail patients form back to him.

## 2019-10-24 NOTE — Telephone Encounter (Signed)
His paper work is out here at the desk for you to see. Thanks and advise on

## 2019-10-24 NOTE — Telephone Encounter (Signed)
No answer, will not fillout per Dr.Jaffe, he was not the last physician he seen.

## 2019-10-24 NOTE — Telephone Encounter (Signed)
He is currently seeing Neurology and needs to obtain the letter from Dr Dohmeier with [facility] ( please provide him with the number) as I had referred him there to determine his ability to work. I see notes in the chart where he called Dr Georgie Chard office today and he declined.

## 2019-10-24 NOTE — Telephone Encounter (Signed)
Patient came by the office requesting a note from Dr. Tomi Likens for work. He said he still has not returned to work. He previously had a letter sent from Dr. Charlott Rakes but that was not sufficient since Dr. Tomi Likens is the last doctor to see him for the problem.  He dropped of a letter from his work as well for reference.  The letter will need to state: -specific restrictions and limitation precluding him from returning to work -current treatment and return to work plan  They are also requesting medical records. Will have patient complete an ROI.

## 2019-10-24 NOTE — Telephone Encounter (Signed)
Patient is needing an updated letter that was written on 07/02/2019.

## 2019-10-24 NOTE — Telephone Encounter (Signed)
Patient was called and informed to contact Neurology office for letter.

## 2019-11-11 ENCOUNTER — Telehealth: Payer: Self-pay
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
## 2019-11-18 ENCOUNTER — Ambulatory Visit: Payer: 59 | Admitting: Family Medicine

## 2019-11-19 ENCOUNTER — Other Ambulatory Visit: Payer: Self-pay

## 2019-11-19 ENCOUNTER — Ambulatory Visit: Payer: 59 | Attending: Family Medicine | Admitting: Family Medicine

## 2019-11-19 ENCOUNTER — Encounter: Payer: Self-pay | Admitting: Family Medicine

## 2019-11-19 VITALS — BP 95/60 | HR 82 | Temp 97.7°F | Ht 73.0 in | Wt 165.0 lb

## 2019-11-19 DIAGNOSIS — G47419 Narcolepsy without cataplexy: Secondary | ICD-10-CM

## 2019-11-19 DIAGNOSIS — I10 Essential (primary) hypertension: Secondary | ICD-10-CM

## 2019-11-19 DIAGNOSIS — R399 Unspecified symptoms and signs involving the genitourinary system: Secondary | ICD-10-CM

## 2019-11-19 DIAGNOSIS — R3989 Other symptoms and signs involving the genitourinary system: Secondary | ICD-10-CM | POA: Diagnosis not present

## 2019-11-19 MED ORDER — TAMSULOSIN HCL 0.4 MG PO CAPS: 0.4000 mg | ORAL_CAPSULE | Freq: Every day | ORAL | 6 refills | Status: DC

## 2019-11-19 MED ORDER — AMLODIPINE BESYLATE 10 MG PO TABS: 10.0000 mg | ORAL_TABLET | Freq: Every day | ORAL | 6 refills | Status: DC
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
## 2019-11-20 ENCOUNTER — Telehealth: Payer: Self-pay

## 2019-11-20 LAB — BASIC METABOLIC PANEL
BUN/Creatinine Ratio: 20 (ref 9–20)
BUN: 24 mg/dL (ref 6–24)
CO2: 21 mmol/L (ref 20–29)
Calcium: 8.7 mg/dL (ref 8.7–10.2)
Chloride: 104 mmol/L (ref 96–106)
Creatinine, Ser: 1.23 mg/dL (ref 0.76–1.27)
GFR calc Af Amer: 78 mL/min/{1.73_m2} (ref >59)
GFR calc non Af Amer: 68 mL/min/{1.73_m2} (ref >59)
Glucose: 102 mg/dL — ABNORMAL HIGH (ref 65–99)
Potassium: 4.4 mmol/L (ref 3.5–5.2)
Sodium: 140 mmol/L (ref 134–144)

## 2019-11-20 NOTE — Telephone Encounter (Signed)
-----   Message from Charlott Rakes, MD sent at 11/20/2019  1:08 PM EST ----- Please inform him labs are normal.

## 2019-11-20 NOTE — Telephone Encounter (Signed)
Patient name and DOB has been verified Patient was informed of lab results. Patient had no questions.  

## 2019-12-15 ENCOUNTER — Ambulatory Visit (INDEPENDENT_AMBULATORY_CARE_PROVIDER_SITE_OTHER): Payer: 59 | Admitting: Neurology

## 2019-12-15 DIAGNOSIS — G4710 Hypersomnia, unspecified: Secondary | ICD-10-CM

## 2019-12-15 DIAGNOSIS — R55 Syncope and collapse: Secondary | ICD-10-CM

## 2019-12-15 DIAGNOSIS — F1410 Cocaine abuse, uncomplicated: Secondary | ICD-10-CM

## 2019-12-15 DIAGNOSIS — R400 Somnolence: Secondary | ICD-10-CM

## 2019-12-15 DIAGNOSIS — I10 Essential (primary) hypertension: Secondary | ICD-10-CM

## 2019-12-19 DIAGNOSIS — R400 Somnolence: Secondary | ICD-10-CM | POA: Insufficient documentation

## 2019-12-19 DIAGNOSIS — R55 Syncope and collapse: Secondary | ICD-10-CM | POA: Insufficient documentation

## 2019-12-19 NOTE — Progress Notes (Signed)
The Wake baseline 02 saturation was 96%, with the lowest being 72%. Time spent below 89% saturation equaled 14 minutes.  The arousals were noted as: 39 were spontaneous, 0 were associated with PLMs, 0 were associated with respiratory events. The patient had a total of 16 Periodic Limb Movements.  The Periodic Limb Movement (PLM) Arousal index was 0/hour.  Audio and video analysis did show the patient to eat in bed, to use his cell phone while munching 1.02 AM/ 2.16 AM, 2.26 AM yelling into the phone, at 3.18 AM cursing at the phone. Further receiving- and taking a phone call at 3.44 AM, starting to argue and disturb other patients. The technologist d/c the study at 4.55 while patient continued to have conversations on his cell phone. EKG was in keeping with normal sinus rhythm (NSR).  IMPRESSION: No organic sleep disorder but very poor sleep habits and no insight into the mechanism of sleep hygiene.   RECOMMENDATIONS: No follow up in Sleep Clinic or at [facility].
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
## 2019-12-22 ENCOUNTER — Telehealth: Payer: Self-pay | Admitting: Neurology

## 2019-12-22 NOTE — Telephone Encounter (Signed)
-----   Message from Larey Seat, MD sent at 12/19/2019  2:06 PM EDT ----- The Wake baseline 02 saturation was 96%, with the lowest being 72%. Time spent below 89% saturation equaled 14 minutes.  The arousals were noted as: 39 were spontaneous, 0 were associated with PLMs, 0 were associated with respiratory events. The patient had a total of 16 Periodic Limb Movements.  The Periodic Limb Movement (PLM) Arousal index was 0/hour.  Audio and video analysis did show the patient to eat in bed, to use his cell phone while munching 1.02 AM/ 2.16 AM, 2.26 AM yelling into the phone, at 3.18 AM cursing at the phone. Further receiving- and taking a phone call at 3.44 AM, starting to argue and disturb other patients. The technologist d/c the study at 4.55 while patient continued to have conversations on his cell phone. EKG was in keeping with normal sinus rhythm (NSR).  IMPRESSION: No organic sleep disorder but very poor sleep habits and no insight into the mechanism of sleep hygiene.   RECOMMENDATIONS: No follow up in Sleep Clinic or at [facility].

## 2019-12-22 NOTE — Telephone Encounter (Signed)
Called the patient to review the study with him. Advised the over night study didn't indicate a sleep disorder. Advised that sleep efficiency wasn't the best. Advised there was only about 4 hrs of sleep. Advised that the study was negative for apnea, oxygen level did well and heart rate well. Pt states he needs a letter to indicate he is cleared [place] back to work. I advised that I can write a letter clearing the patient that there was no sleep disorder identified, but as far as clearing the patient to go back to work that would need to come from primary neurologist, Dr Tomi Likens or PCP because the work up may need to include cardiology for the syncope episode, advised the patient I will discuss with her about the letter. Pt verbalized understanding. Pt asking for a call to let him know when ready.

## 2019-12-31 MED ORDER — MODAFINIL 200 MG PO TABS: 200.0000 mg | ORAL_TABLET | Freq: Every day | ORAL | 0 refills | Status: DC

## 2019-12-31 NOTE — Addendum Note (Signed)
Addended by: Darleen Crocker on: 12/31/2019 12:04 PM   Modules accepted: Orders

## 2019-12-31 NOTE — Telephone Encounter (Signed)
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 Dr Dohmeier did inform if was true narcolepsy the patient would have fell asleep. This would leave her to state he may have sleepiness related to shift work. From a SLEEP MD we would clear the patient to work depending on if the medication is helping with the daytime sleepiness. Patient was sent to [place] as a referral for sleep and is already established with Dr Georgie Chard office for primary neurology concerns and needs.  Pt is asking if we can complete paper work and release to work [employer]. Advised that we complete the paperwork from a sleep specialist standpoint but he would need to still get clearance from a neurology standpoint from his primary neurologist in case they would like to provide any other testing or work up since essentially the sleep work up is negative.

## 2020-01-01 NOTE — Telephone Encounter (Signed)
  The patient was referred by Dr Tomi Likens, who has established a patient relation as of his charted encounter and used this encounter as the base for a sleep medicine referral- The patient  has been reporting hypersomnia in a setting of shift work. 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 Not valid for MSLT.  No toxicology screen was obtained.  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  He can take this medication to prevent excessive drowsiness- He has to be sober and not on cocaine if he decides to take this.  I have no other resolution for his hypersomnia.   I have no opinion as to the syncope, and his referring physician will have to address this- THE [place] , this is a second opinion when coming form another neurologist- and that part is done.  Larey Seat, MD

## 2020-01-12 ENCOUNTER — Telehealth: Payer: Self-pay | Admitting: Neurology

## 2020-01-12 ENCOUNTER — Encounter: Payer: Self-pay | Admitting: Neurology

## 2020-01-12 DIAGNOSIS — G4726 Circadian rhythm sleep disorder, shift work type: Secondary | ICD-10-CM | POA: Insufficient documentation

## 2020-01-12 NOTE — Telephone Encounter (Signed)
PA submitted through cover my meds/[employer]. Will wait for a response. [id number]

## 2020-01-13 ENCOUNTER — Encounter: Payer: Self-pay | Admitting: Neurology

## 2020-01-13 NOTE — Telephone Encounter (Signed)
PA was denied, appeal letter written and sent to the [facility] fax number provided. Will wait to see if they review and approve after appeal

## 2020-01-14 ENCOUNTER — Telehealth: Payer: Self-pay | Admitting: Neurology

## 2020-01-14 NOTE — Telephone Encounter (Signed)
Pt is asking for a call from RN to discuss what is next for his care, please call

## 2020-01-14 NOTE — Telephone Encounter (Signed)
Called the patient back to discuss. Pt answered and was on the phone with his long term disability representative Tomi Bamberger pt merged the calls so that I would be available to answer any questions she has. Pt gave verbal ok to discuss with her his medical condition. Advised that our treatment plan is to start the medication to hopefully assist with sleepiness that is related to shift work disorder. Was asked if narcolepsy was the diagnosis. Advised that we were not able to get enough data from the overnight study to complete the testing to determine narcolepsy. Advised both patient and representative that our plan is to try the medication modafinil and that we are waiting on insurance to see if it will get approved. I completed and sent a appeal letter to the insurance yesterday and waiting to hear back.   PT may call back. IF he does please schedule the patient for next available opening with Dr Brett Fairy and advise that we are pending hearing back from insurance on them to approve the medication for him.

## 2020-01-20 NOTE — Telephone Encounter (Signed)
PA was approved from 01/15/20-01/14/21.

## 2020-02-09 ENCOUNTER — Telehealth: Payer: Self-pay | Admitting: Neurology

## 2020-02-09 NOTE — Telephone Encounter (Signed)
Called the patient, there was no answer. LVM for the patient. The script was sent to the pharmacy in April and PA was approved the pharmacy should have the medication ready. I have resent to the pharmacy as well.([facility] [address]) Advised the patient that Dr. Brett Fairy would like to see him in a follow up visit 2 weeks after the patient starts the medication. Advised the patient to call back to schedule that once he picks the medication up.  If pt calls back please advise the above information.

## 2020-02-09 NOTE — Telephone Encounter (Signed)
Phone rep checked office voicemail's, at 1:46 pt left message stating he has not heard from anyone re: his medications or his next appointment.  Please call

## 2020-04-28 ENCOUNTER — Telehealth: Payer: Self-pay | Admitting: Neurology

## 2020-04-28 ENCOUNTER — Telehealth: Payer: Self-pay | Admitting: Family Medicine
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
## 2020-04-28 NOTE — Telephone Encounter (Signed)
Patient was called and informed that medications will be discussed at upcoming visit.

## 2020-04-28 NOTE — Telephone Encounter (Signed)
Copied from [place] [id number]. Topic: General - Other >> Apr 28, 2020  2:18 PM Keene Breath wrote: Reason for CRM: Patient called to speak with the nurse regarding his medication.  He is having trouble paying for the medication.  Please call patient at [phone]

## 2020-04-28 NOTE — Telephone Encounter (Signed)
Pt is needing to discuss with RN his modafinil (PROVIGIL) 200 MG tablet Please advise.

## 2020-04-28 NOTE — Telephone Encounter (Signed)
Patient called about medication advice. He has been homeless since his last visit so he wasn't able to afford to pick up his medication. He is still having all the same issues as before, has not returned to work, or been driving. He just got set up in the [employer]. Please call.

## 2020-04-29 NOTE — Telephone Encounter (Signed)
Dr. Tomi Likens is out of the office and while I am not sure exactly the question since it doesn't appear that Dr. Tomi Likens has RX meds, it does appear that if he wants Dr. Tomi Likens to address medical needs that he needs a f/u appt.  He hasn't been seen in almost a year and that was just his first visit.

## 2020-04-29 NOTE — Telephone Encounter (Signed)
Pt wants to schedule a visit
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
## 2020-05-05 ENCOUNTER — Ambulatory Visit: Payer: Self-pay | Admitting: Physician Assistant

## 2020-05-12 ENCOUNTER — Ambulatory Visit: Payer: 59 | Admitting: Physician Assistant

## 2020-05-18 ENCOUNTER — Ambulatory Visit: Payer: 59 | Admitting: Family Medicine
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
## 2020-05-24 ENCOUNTER — Encounter: Payer: Self-pay | Admitting: Physician Assistant

## 2020-05-24 ENCOUNTER — Other Ambulatory Visit: Payer: Self-pay

## 2020-05-24 ENCOUNTER — Ambulatory Visit (INDEPENDENT_AMBULATORY_CARE_PROVIDER_SITE_OTHER): Payer: 59 | Admitting: Physician Assistant

## 2020-05-24 VITALS — BP 118/84 | HR 83 | Ht 73.0 in | Wt 159.0 lb

## 2020-05-24 DIAGNOSIS — I10 Essential (primary) hypertension: Secondary | ICD-10-CM

## 2020-05-24 DIAGNOSIS — G47419 Narcolepsy without cataplexy: Secondary | ICD-10-CM

## 2020-05-24 DIAGNOSIS — F1910 Other psychoactive substance abuse, uncomplicated: Secondary | ICD-10-CM

## 2020-05-24 NOTE — Patient Instructions (Signed)
Medication Instructions:  Your physician recommends that you continue on your current medications as directed. Please refer to the Current Medication list given to you today.  *If you need a refill on your cardiac medications before your next appointment, please call your pharmacy*  Lab Work: None ordered today  Testing/Procedures: None ordered today  Follow-Up: At [facility], you and your health needs are our priority.  As part of our continuing mission to provide you with exceptional heart care, we have created designated Provider Care Teams.  These Care Teams include your primary Cardiologist (physician) and Advanced Practice Providers (APPs -  Physician Assistants and Nurse Practitioners) who all work together to provide you with the care you need, when you need it.  Your next appointment:   As needed  The format for your next appointment:   In Person  Provider:   You may see Sherren Mocha, MD or one of the following Advanced Practice Providers on your designated Care Team:    Richardson Dopp, PA-C  [place], [place]

## 2020-06-09 ENCOUNTER — Other Ambulatory Visit: Payer: Self-pay | Admitting: Critical Care Medicine

## 2020-06-09 ENCOUNTER — Encounter: Payer: Self-pay | Admitting: Critical Care Medicine

## 2020-06-09 MED ORDER — MODAFINIL 200 MG PO TABS: 200.0000 mg | ORAL_TABLET | Freq: Every day | ORAL | 0 refills | Status: DC

## 2020-06-09 MED FILL — MODAFINIL 200 MG TABLET: 200 | 30 days supply | Qty: 30 | Fill #0

## 2020-06-09 NOTE — Progress Notes (Signed)
Patient ID: Marvin Mckee, male   DOB: [birth year], 54 y.o.   MRN: [id number] This is a 54 year old male seen in the [facility].  He has a diagnosis of narcolepsy made by neurology previously with Dr. Tomi Likens.  He did have a sleep evaluation which was incomplete and that he could not fall asleep completely during this evaluation but the sleep specialist did not diagnose him with any other organic sleep condition.  He does have a history of cocaine use his last use was 2 weeks ago.  He states he uses it to keep himself awake.  He works as a Building control surveyor in the past but now is trying to seek disability.  He was denied long-term disability last year and for this lost and, lost his job and his housing.  He has been at the [employer] now for 1 month.  He follows with Dr. Teressa Senter in the [facility].  He was prescribed Provigil 200 mg daily however he could not afford this.  He is requesting some bridging medication so he can get on the Provigil.    Exam is unremarkable blood pressure is adequate  Impression is that of narcolepsy needing prescription for Provigil   I will give him a 30-day supply through the [place] assistance fund and I will partner with his primary care provider for follow-up

## 2020-06-28 ENCOUNTER — Telehealth: Payer: Self-pay | Admitting: *Deleted

## 2020-06-28 ENCOUNTER — Other Ambulatory Visit: Payer: Self-pay

## 2020-06-28 ENCOUNTER — Ambulatory Visit: Payer: Self-pay | Attending: Family Medicine | Admitting: Family Medicine

## 2020-06-28 ENCOUNTER — Encounter: Payer: Self-pay | Admitting: Family Medicine

## 2020-06-28 DIAGNOSIS — G47419 Narcolepsy without cataplexy: Secondary | ICD-10-CM
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
## 2020-06-28 NOTE — Progress Notes (Signed)
States that medication is working.

## 2020-07-08 ENCOUNTER — Ambulatory Visit: Payer: Self-pay | Admitting: Neurology
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
## 2020-07-14 ENCOUNTER — Other Ambulatory Visit: Payer: Self-pay | Admitting: Critical Care Medicine

## 2020-07-14 ENCOUNTER — Encounter: Payer: Self-pay | Admitting: Critical Care Medicine

## 2020-07-14 MED ORDER — MODAFINIL 200 MG PO TABS: 200.0000 mg | ORAL_TABLET | Freq: Every day | ORAL | 0 refills | Status: DC

## 2020-07-14 NOTE — Progress Notes (Signed)
Patient ID: Marvin Mckee, male   DOB: July 12, 1966, 54 y.o.   MRN: [id number] This is a 54 year old male history of severe narcolepsy needing refills on his Provigil which refill the last time he also complains of tooth pain  On exam he has 2 fractured teeth one in the left upper the other in the right lower jaw which need to be extracted he does not have any insurance  Impression is that of narcolepsy needing refills on Provigil which she has responded well to I gave him a 200 mg dose daily and 30 count the secondary issue is that of the fractured teeth we will see if we can get patient assistance to go to urgent tooth for the extractions he does not have any insurance and has not been able to achieve the orange card

## 2020-07-15 ENCOUNTER — Encounter: Payer: Self-pay | Admitting: Critical Care Medicine

## 2020-07-15 ENCOUNTER — Other Ambulatory Visit: Payer: Self-pay | Admitting: Critical Care Medicine

## 2020-07-15 ENCOUNTER — Other Ambulatory Visit (HOSPITAL_COMMUNITY): Payer: Self-pay | Admitting: Critical Care Medicine

## 2020-07-15 MED FILL — MODAFINIL 200 MG TABLET: 200 | 30 days supply | Qty: 30 | Fill #0

## 2020-07-15 NOTE — Telephone Encounter (Signed)
° °  Notes to clinic:  looks like this medication was sent yesterday Medication is not assigned to a protocol Needs to be sent to [facility]    Requested Prescriptions  Pending Prescriptions Disp Refills   modafinil (PROVIGIL) 200 MG tablet [Pharmacy Med Name: MODAFINIL 200 MG TABLET 200 Tablet] 30 tablet 0    Sig: Take 1 tablet (200 mg total) by mouth daily.      Off-Protocol Failed - 07/15/2020  8:49 AM      Failed - Medication not assigned to a protocol, review manually.      Passed - Valid encounter within last 12 months    Recent Outpatient Visits           2 weeks ago Primary narcolepsy without cataplexy   [place], [name], MD   7 months ago Primary narcolepsy without cataplexy   [place], Enobong, MD   1 year ago Lower urinary tract symptoms   [place], Enobong, MD   1 year ago Essential hypertension   [facility] Charlott Rakes, MD

## 2020-07-16 ENCOUNTER — Telehealth: Payer: Self-pay | Admitting: Pediatric Intensive Care

## 2020-07-16 NOTE — Telephone Encounter (Signed)
Call to transportation services to set up ride to neurology appointment on 10/28. Lisette Abu RN BSN CNP [phone]

## 2020-07-16 NOTE — Telephone Encounter (Signed)
Call to client- client had left VM for this CN. IDx2. CN verified that client received his medication and transportation release form. CN explained process of car pickup. CN advised that she will call transportation services to set up ride today and that he will receive an automated text message. CN verified clinic address. Client states that he has ongoing dental issues. CN advised that she will link him up to [employer] to complete [employer] and C[employer] applications. Lisette Abu RN BSN CNP
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
## 2020-07-21 ENCOUNTER — Telehealth: Payer: Self-pay | Admitting: Pediatric Intensive Care

## 2020-07-21 NOTE — Telephone Encounter (Signed)
Call from client- he is concerned his ride for tomorrow is cancelled. CN will call transportation services to clarify. Lisette Abu RN BSN CNP [phone]

## 2020-07-21 NOTE — Telephone Encounter (Signed)
Left HIPAA complaint VM for client to return call. Lisette Abu RN BSN CNP [phone]

## 2020-07-21 NOTE — Telephone Encounter (Signed)
Call to transportation services. Client's ride is confirmed for ETA pickup [id number]. Lisette Abu Rn BSN CNP [phone]

## 2020-07-22 ENCOUNTER — Encounter: Payer: Self-pay | Admitting: Neurology

## 2020-07-22 ENCOUNTER — Ambulatory Visit (INDEPENDENT_AMBULATORY_CARE_PROVIDER_SITE_OTHER): Payer: Self-pay | Admitting: Neurology

## 2020-07-22 ENCOUNTER — Other Ambulatory Visit: Payer: Self-pay

## 2020-07-22 VITALS — BP 127/84 | HR 80 | Ht 72.0 in | Wt 158.4 lb

## 2020-07-22 DIAGNOSIS — G47411 Narcolepsy with cataplexy: Secondary | ICD-10-CM

## 2020-07-22 NOTE — Patient Instructions (Signed)
Follow up with the sleep specialist at [facility].

## 2020-07-22 NOTE — Telephone Encounter (Signed)
Encounter created in error

## 2020-07-28 ENCOUNTER — Encounter: Payer: Self-pay | Admitting: Critical Care Medicine

## 2020-07-29 NOTE — Progress Notes (Signed)
This is seen in return follow-up at the [place] clinic he is wishing to have a form filled out for him to obtain housing I did perform this for him as well and documented he has his Provigil medication is taking it regularly and has recently seen neurology nothing else was needed

## 2020-09-03 ENCOUNTER — Ambulatory Visit: Payer: 59 | Admitting: Neurology

## 2020-09-05 ENCOUNTER — Emergency Department (HOSPITAL_BASED_OUTPATIENT_CLINIC_OR_DEPARTMENT_OTHER): Payer: No Typology Code available for payment source

## 2020-09-05 ENCOUNTER — Encounter (HOSPITAL_BASED_OUTPATIENT_CLINIC_OR_DEPARTMENT_OTHER): Payer: Self-pay

## 2020-09-05 ENCOUNTER — Emergency Department (HOSPITAL_BASED_OUTPATIENT_CLINIC_OR_DEPARTMENT_OTHER)
Admission: EM | Admit: 2020-09-05 | Discharge: 2020-09-05 | Disposition: A | Discharge status: Home / Self Care | Payer: No Typology Code available for payment source | Attending: Emergency Medicine | Admitting: Emergency Medicine

## 2020-09-05 ENCOUNTER — Other Ambulatory Visit: Payer: Self-pay

## 2020-09-05 DIAGNOSIS — Y92410 Unspecified street and highway as the place of occurrence of the external cause: Secondary | ICD-10-CM | POA: Diagnosis not present

## 2020-09-05 DIAGNOSIS — M79605 Pain in left leg: Secondary | ICD-10-CM | POA: Insufficient documentation

## 2020-09-05 DIAGNOSIS — M545 Low back pain, unspecified: Secondary | ICD-10-CM | POA: Diagnosis not present

## 2020-09-05 DIAGNOSIS — F17210 Nicotine dependence, cigarettes, uncomplicated: Secondary | ICD-10-CM | POA: Insufficient documentation

## 2020-09-05 DIAGNOSIS — M79604 Pain in right leg: Secondary | ICD-10-CM | POA: Diagnosis not present

## 2020-09-05 DIAGNOSIS — Z85528 Personal history of other malignant neoplasm of kidney: Secondary | ICD-10-CM | POA: Insufficient documentation

## 2020-09-05 DIAGNOSIS — R519 Headache, unspecified: Secondary | ICD-10-CM | POA: Insufficient documentation

## 2020-09-05 DIAGNOSIS — I10 Essential (primary) hypertension: Secondary | ICD-10-CM | POA: Diagnosis not present

## 2020-09-05 MED ORDER — CYCLOBENZAPRINE HCL 10 MG PO TABS: 10.0000 mg | ORAL_TABLET | Freq: Two times a day (BID) | ORAL | 0 refills | Status: DC | PRN
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
## 2020-09-09 ENCOUNTER — Ambulatory Visit: Payer: 59 | Admitting: Neurology

## 2020-09-13 ENCOUNTER — Ambulatory Visit: Payer: Self-pay | Admitting: Physician Assistant

## 2020-09-13 ENCOUNTER — Other Ambulatory Visit: Payer: Self-pay

## 2020-09-13 VITALS — BP 141/104 | HR 85 | Temp 97.1°F | Ht 73.0 in | Wt 166.0 lb

## 2020-09-13 DIAGNOSIS — M79605 Pain in left leg: Secondary | ICD-10-CM

## 2020-09-13 DIAGNOSIS — M545 Low back pain, unspecified: Secondary | ICD-10-CM

## 2020-09-13 DIAGNOSIS — M542 Cervicalgia: Secondary | ICD-10-CM

## 2020-09-13 MED ORDER — KETOROLAC TROMETHAMINE 60 MG/2ML IM SOLN
60.0000 mg | Freq: Once | INTRAMUSCULAR | Status: AC
  Administered 2020-09-13: 11:00:00 60 mg via INTRAMUSCULAR

## 2020-09-13 MED ORDER — LIDOCAINE 1.8 % EX PTCH: 1.0000 | MEDICATED_PATCH | Freq: Every day | CUTANEOUS | 0 refills | Status: DC | PRN

## 2020-09-13 MED ORDER — METHYLPREDNISOLONE ACETATE 80 MG/ML IJ SUSP
80.0000 mg | Freq: Once | INTRAMUSCULAR | Status: AC
Start: 2020-09-13 — End: 2020-09-13
  Administered 2020-09-13: 11:00:00 80 mg via INTRAMUSCULAR
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
## 2020-09-13 NOTE — Patient Instructions (Signed)
I sent a prescription for lidocaine patches to your pharmacy.  Continue using the muscle relaxers and Tylenol as needed to help with pain.  Please feel free to return to the mobile unit on Thursday for further evaluation if needed  I hope that you feel better soon  Kennieth Rad, PA-C Physician Assistant [facility] [email or web address]    Acute Back Pain, Adult Acute back pain is sudden and usually short-lived. It is often caused by an injury to the muscles and tissues in the back. The injury may result from:  A muscle or ligament getting overstretched or torn (strained). Ligaments are tissues that connect bones to each other. Lifting something improperly can cause a back strain.  Wear and tear (degeneration) of the spinal disks. Spinal disks are circular tissue that provides cushioning between the bones of the spine (vertebrae).  Twisting motions, such as while playing sports or doing yard work.  A hit to the back.  Arthritis. You may have a physical exam, lab tests, and imaging tests to find the cause of your pain. Acute back pain usually goes away with rest and home care. Follow these instructions at home: Managing pain, stiffness, and swelling  Take over-the-counter and prescription medicines only as told by your health care provider.  Your health care provider may recommend applying ice during the first 24-48 hours after your pain starts. To do this: ? Put ice in a plastic bag. ? Place a towel between your skin and the bag. ? Leave the ice on for 20 minutes, 2-3 times a day.  If directed, apply heat to the affected area as often as told by your health care provider. Use the heat source that your health care provider recommends, such as a moist heat pack or a heating pad. ? Place a towel between your skin and the heat source. ? Leave the heat on for 20-30 minutes. ? Remove the heat if your skin turns bright red. This  is especially important if you are unable to feel pain, heat, or cold. You have a greater risk of getting burned. Activity   Do not stay in bed. Staying in bed for more than 1-2 days can delay your recovery.  Sit up and stand up straight. Avoid leaning forward when you sit, or hunching over when you stand. ? If you work at a desk, sit close to it so you do not need to lean over. Keep your chin tucked in. Keep your neck drawn back, and keep your elbows bent at a right angle. Your arms should look like the letter "L." ? Sit high and close to the steering wheel when you drive. Add lower back (lumbar) support to your car seat, if needed.  Take short walks on even surfaces as soon as you are able. Try to increase the length of time you walk each day.  Do not sit, drive, or stand in one place for more than 30 minutes at a time. Sitting or standing for long periods of time can put stress on your back.  Do not drive or use heavy machinery while taking prescription pain medicine.  Use proper lifting techniques. When you bend and lift, use positions that put less stress on your back: ? [place] your knees. ? Keep the load close to your body. ? Avoid twisting.  Exercise regularly as told by your health care provider. Exercising helps your back heal faster and helps prevent back injuries by keeping muscles strong and flexible.  Work  with a physical therapist to make a safe exercise program, as recommended by your health care provider. Do any exercises as told by your physical therapist. Lifestyle  Maintain a healthy weight. Extra weight puts stress on your back and makes it difficult to have good posture.  Avoid activities or situations that make you feel anxious or stressed. Stress and anxiety increase muscle tension and can make back pain worse. Learn ways to manage anxiety and stress, such as through exercise. General instructions  Sleep on a firm mattress in a comfortable position. Try lying on  your side with your knees slightly bent. If you lie on your back, put a pillow under your knees.  Follow your treatment plan as told by your health care provider. This may include: ? Cognitive or behavioral therapy. ? Acupuncture or massage therapy. ? Meditation or yoga. Contact a health care provider if:  You have pain that is not relieved with rest or medicine.  You have increasing pain going down into your legs or buttocks.  Your pain does not improve after 2 weeks.  You have pain at night.  You lose weight without trying.  You have a fever or chills. Get help right away if:  You develop new bowel or bladder control problems.  You have unusual weakness or numbness in your arms or legs.  You develop nausea or vomiting.  You develop abdominal pain.  You feel faint. Summary  Acute back pain is sudden and usually short-lived.  Use proper lifting techniques. When you bend and lift, use positions that put less stress on your back.  Take over-the-counter and prescription medicines and apply heat or ice as directed by your health care provider. This information is not intended to replace advice given to you by your health care provider. Make sure you discuss any questions you have with your health care provider. Document Revised: 12/31/2018 Document Reviewed: 04/25/2017 Elsevier Patient Education  [place].

## 2020-09-14 DIAGNOSIS — M542 Cervicalgia: Secondary | ICD-10-CM | POA: Insufficient documentation

## 2020-09-14 DIAGNOSIS — M79605 Pain in left leg: Secondary | ICD-10-CM | POA: Insufficient documentation

## 2020-09-16 ENCOUNTER — Ambulatory Visit: Payer: Self-pay | Admitting: Physician Assistant

## 2020-09-16 ENCOUNTER — Other Ambulatory Visit: Payer: Self-pay

## 2020-09-16 VITALS — BP 130/92 | HR 73 | Temp 98.2°F | Resp 18 | Ht 73.0 in | Wt 164.0 lb

## 2020-09-16 DIAGNOSIS — M545 Low back pain, unspecified: Secondary | ICD-10-CM

## 2020-09-16 DIAGNOSIS — M79605 Pain in left leg: Secondary | ICD-10-CM

## 2020-09-16 MED ORDER — KETOROLAC TROMETHAMINE 60 MG/2ML IM SOLN
60.0000 mg | Freq: Once | INTRAMUSCULAR | Status: AC
  Administered 2020-09-16: 12:00:00 60 mg via INTRAMUSCULAR

## 2020-09-16 NOTE — Progress Notes (Signed)
Patient has not picked up medication from previous visit. Patient shares in house medication provided relief.

## 2020-09-16 NOTE — Progress Notes (Signed)
Established Patient Office Visit  Subjective:  Patient ID: Marvin Mckee, male    DOB: 10-06-1965  Age: 54 y.o. MRN: [id number]  CC:  Chief Complaint  Patient presents with  . Back Pain    HPI Marvin Mckee reports that he continues to have low back pain radiating down his left leg.  Reports that he was unable to start the lidocaine patches due to financial constraints.  Reports that he does plan on getting an appointment with a chiropractor for treatment.  Reports that the injections he was given on Monday, September 13, 2020 in the mobile medicine unit did offer him some relief but the pain has returned.  Denies numbness or tingling, saddle anesthesia.  Past Medical History:  Diagnosis Date  . Back pain   . Cancer ([place])   . Cocaine abuse ([place])   . DDD (degenerative disc disease), lumbar   . Hypertension     Past Surgical History:  Procedure Laterality Date  . KIDNEY SURGERY      Family History  Problem Relation Age of Onset  . Cancer Mother   . Cancer Father     Social History   Socioeconomic History  . Marital status: Single    Spouse name: Not on file  . Number of children: Not on file  . Years of education: Not on file  . Highest education level: Not on file  Occupational History  . Not on file  Tobacco Use  . Smoking status: Current Every Day Smoker    Packs/day: 0.50    Types: Cigarettes  . Smokeless tobacco: Never Used  Substance and Sexual Activity  . Alcohol use: Not Currently  . Drug use: Not Currently  . Sexual activity: Not on file  Other Topics Concern  . Not on file  Social History Narrative   Will be moving into own place with girlfriend soon      Right handed      Caffeine - none      Exercise - some      Education - college some   Social Determinants of Health   Financial Resource Strain: Not on file  Food Insecurity: Not on file  Transportation Needs: Not on file  Physical Activity: Not on file  Stress: Not on file   Social Connections: Not on file  Intimate Partner Violence: Not on file    Outpatient Medications Prior to Visit  Medication Sig Dispense Refill  . cyclobenzaprine (FLEXERIL) 10 MG tablet Take 1 tablet (10 mg total) by mouth 2 (two) times daily as needed for muscle spasms. 20 tablet 0  . Lidocaine 1.8 % PTCH Apply 1 each topically daily as needed. 10 patch 0  . modafinil (PROVIGIL) 200 MG tablet Take 1 tablet (200 mg total) by mouth daily. 30 tablet 0   No facility-administered medications prior to visit.    No Known Allergies  ROS Review of Systems  Constitutional: Negative.   HENT: Negative.   Eyes: Negative.   Respiratory: Negative.   Cardiovascular: Negative.   Gastrointestinal: Negative.   Endocrine: Negative.   Genitourinary: Negative for testicular pain.  Musculoskeletal: Positive for back pain.  Skin: Negative.   Allergic/Immunologic: Negative.   Neurological: Negative.   Hematological: Negative.   Psychiatric/Behavioral: Negative.       Objective:    Physical Exam Vitals and nursing note reviewed.  Constitutional:      Appearance: Normal appearance.  HENT:     Head: Normocephalic and atraumatic.  Right Ear: External ear normal.     Left Ear: External ear normal.     Nose: Nose normal.     Mouth/Throat:     Mouth: Mucous membranes are moist.     Pharynx: Oropharynx is clear.  Eyes:     Extraocular Movements: Extraocular movements intact.     Conjunctiva/sclera: Conjunctivae normal.     Pupils: Pupils are equal, round, and reactive to light.  Cardiovascular:     Rate and Rhythm: Normal rate and regular rhythm.     Pulses: Normal pulses.  Pulmonary:     Effort: Pulmonary effort is normal.     Breath sounds: Normal breath sounds.  Musculoskeletal:     Cervical back: Normal, normal range of motion and neck supple.     Thoracic back: Normal.     Lumbar back: Spasms and tenderness present.  Skin:    General: Skin is warm and dry.  Neurological:      General: No focal deficit present.     Mental Status: He is alert and oriented to person, place, and time.  Psychiatric:        Mood and Affect: Mood normal.        Behavior: Behavior normal.        Thought Content: Thought content normal.        Judgment: Judgment normal.     BP (!) 130/92 (BP Location: Right Arm, Patient Position: Sitting, Cuff Size: Normal)   Pulse 73   Temp 98.2 F (36.8 C) (Oral)   Resp 18   Ht 6\' 1"  (1.854 m)   Wt 164 lb (74.4 kg)   SpO2 99%   BMI 21.64 kg/m  Wt Readings from Last 3 Encounters:  09/16/20 164 lb (74.4 kg)  09/13/20 166 lb (75.3 kg)  09/05/20 175 lb (79.4 kg)     Health Maintenance Due  Topic Date Due  . Hepatitis C Screening  Never done  . TETANUS/TDAP  Never done  . COLONOSCOPY  Never done  . INFLUENZA VACCINE  Never done  . COVID-19 Vaccine (2 - Pfizer 3-dose booster series) 07/16/2020    There are no preventive care reminders to display for this patient.  No results found for: TSH Lab Results  Component Value Date   WBC 5.5 02/21/2019   HGB 13.3 02/21/2019   HCT 41.2 02/21/2019   MCV 84.4 02/21/2019   PLT 227 02/21/2019   Lab Results  Component Value Date   NA 140 11/19/2019   K 4.4 11/19/2019   CO2 21 11/19/2019   GLUCOSE 102 (H) 11/19/2019   BUN 24 11/19/2019   CREATININE 1.23 11/19/2019   CALCIUM 8.7 11/19/2019   ANIONGAP 7 02/21/2019   No results found for: CHOL No results found for: HDL No results found for: LDLCALC No results found for: TRIG No results found for: CHOLHDL No results found for: HGBA1C    Assessment & Plan:   Problem List Items Addressed This Visit      Other   Low back pain radiating to left lower extremity - Primary    1. Low back pain radiating to left lower extremity Patient encouraged to speak with the congregational nurse at the [facility] for financial assistance and picking up his lidocaine patches.  Patient understands and agrees - ketorolac (TORADOL) injection 60  mg   I have reviewed the patient's medical history (PMH, PSH, Social History, Family History, Medications, and allergies) , and have been updated if relevant. I spent 20  minutes reviewing chart and  face to face time with patient.     Meds ordered this encounter  Medications  . ketorolac (TORADOL) injection 60 mg    Follow-up: Return if symptoms worsen or fail to improve.    Kasandra Knudsen Mayers, PA-C

## 2020-09-16 NOTE — Patient Instructions (Signed)
Acute Back Pain, Adult Acute back pain is sudden and usually short-lived. It is often caused by an injury to the muscles and tissues in the back. The injury may result from:  A muscle or ligament getting overstretched or torn (strained). Ligaments are tissues that connect bones to each other. Lifting something improperly can cause a back strain.  Wear and tear (degeneration) of the spinal disks. Spinal disks are circular tissue that provides cushioning between the bones of the spine (vertebrae).  Twisting motions, such as while playing sports or doing yard work.  A hit to the back.  Arthritis. You may have a physical exam, lab tests, and imaging tests to find the cause of your pain. Acute back pain usually goes away with rest and home care. Follow these instructions at home: Managing pain, stiffness, and swelling  Take over-the-counter and prescription medicines only as told by your health care provider.  Your health care provider may recommend applying ice during the first 24-48 hours after your pain starts. To do this: ? Put ice in a plastic bag. ? Place a towel between your skin and the bag. ? Leave the ice on for 20 minutes, 2-3 times a day.  If directed, apply heat to the affected area as often as told by your health care provider. Use the heat source that your health care provider recommends, such as a moist heat pack or a heating pad. ? Place a towel between your skin and the heat source. ? Leave the heat on for 20-30 minutes. ? Remove the heat if your skin turns bright red. This is especially important if you are unable to feel pain, heat, or cold. You have a greater risk of getting burned. Activity   Do not stay in bed. Staying in bed for more than 1-2 days can delay your recovery.  Sit up and stand up straight. Avoid leaning forward when you sit, or hunching over when you stand. ? If you work at a desk, sit close to it so you do not need to lean over. Keep your chin tucked  in. Keep your neck drawn back, and keep your elbows bent at a right angle. Your arms should look like the letter "L." ? Sit high and close to the steering wheel when you drive. Add lower back (lumbar) support to your car seat, if needed.  Take short walks on even surfaces as soon as you are able. Try to increase the length of time you walk each day.  Do not sit, drive, or stand in one place for more than 30 minutes at a time. Sitting or standing for long periods of time can put stress on your back.  Do not drive or use heavy machinery while taking prescription pain medicine.  Use proper lifting techniques. When you bend and lift, use positions that put less stress on your back: ? Bend your knees. ? Keep the load close to your body. ? Avoid twisting.  Exercise regularly as told by your health care provider. Exercising helps your back heal faster and helps prevent back injuries by keeping muscles strong and flexible.  Work with a physical therapist to make a safe exercise program, as recommended by your health care provider. Do any exercises as told by your physical therapist. Lifestyle  Maintain a healthy weight. Extra weight puts stress on your back and makes it difficult to have good posture.  Avoid activities or situations that make you feel anxious or stressed. Stress and anxiety increase muscle   tension and can make back pain worse. Learn ways to manage anxiety and stress, such as through exercise. General instructions  Sleep on a firm mattress in a comfortable position. Try lying on your side with your knees slightly bent. If you lie on your back, put a pillow under your knees.  Follow your treatment plan as told by your health care provider. This may include: ? Cognitive or behavioral therapy. ? Acupuncture or massage therapy. ? Meditation or yoga. Contact a health care provider if:  You have pain that is not relieved with rest or medicine.  You have increasing pain going down  into your legs or buttocks.  Your pain does not improve after 2 weeks.  You have pain at night.  You lose weight without trying.  You have a fever or chills. Get help right away if:  You develop new bowel or bladder control problems.  You have unusual weakness or numbness in your arms or legs.  You develop nausea or vomiting.  You develop abdominal pain.  You feel faint. Summary  Acute back pain is sudden and usually short-lived.  Use proper lifting techniques. When you bend and lift, use positions that put less stress on your back.  Take over-the-counter and prescription medicines and apply heat or ice as directed by your health care provider. This information is not intended to replace advice given to you by your health care provider. Make sure you discuss any questions you have with your health care provider. Document Revised: 12/31/2018 Document Reviewed: 04/25/2017 Elsevier Patient Education  2020 Elsevier Inc.  

## 2020-09-23 ENCOUNTER — Ambulatory Visit: Payer: Self-pay | Admitting: Physician Assistant

## 2020-09-23 ENCOUNTER — Other Ambulatory Visit: Payer: Self-pay

## 2020-09-23 VITALS — BP 161/96 | HR 60 | Temp 98.7°F | Resp 18

## 2020-09-23 DIAGNOSIS — M79605 Pain in left leg: Secondary | ICD-10-CM

## 2020-09-23 DIAGNOSIS — M545 Low back pain, unspecified: Secondary | ICD-10-CM

## 2020-09-23 MED ORDER — METHYLPREDNISOLONE ACETATE 80 MG/ML IJ SUSP
80.0000 mg | Freq: Once | INTRAMUSCULAR | Status: AC
  Administered 2020-09-23: 12:00:00 80 mg via INTRAMUSCULAR

## 2020-09-23 MED ORDER — KETOROLAC TROMETHAMINE 60 MG/2ML IM SOLN
60.0000 mg | Freq: Once | INTRAMUSCULAR | Status: AC
Start: 2020-09-23 — End: 2020-09-23
  Administered 2020-09-23: 12:00:00 60 mg via INTRAMUSCULAR

## 2020-09-23 NOTE — Patient Instructions (Signed)
The number for [facility] billing is [phone]  I hope that you feel better soon, please let us know if there is anything else we can do for you  Roney Jaffe, PA-C Physician Assistant [facility] [email or web address]    COVID-19 Vaccine Information can be found at: [email or web address] For questions related to vaccine distribution or appointments, please email [email or web address] or call [phone].

## 2020-09-23 NOTE — Progress Notes (Signed)
Patient has eaten today.  Patient has taken medicatIon today. Patient complains of back pain at an 8.
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
## 2020-09-27 ENCOUNTER — Telehealth: Payer: Self-pay | Admitting: Pediatric Intensive Care

## 2020-09-27 NOTE — Telephone Encounter (Signed)
Call to client- client had sent text message to this CN regarding prescriptions. Client IDx2. CN advised client to see Feliberto Harts RN, nurse at [facility] as he is living at that shelter now. Client needs assistance with medication and transportation to medical appointments.  Shann Medal RN BSN CNP [phone]

## 2020-10-02 ENCOUNTER — Ambulatory Visit: Payer: Self-pay

## 2020-11-02 ENCOUNTER — Ambulatory Visit: Payer: Self-pay | Admitting: Family Medicine

## 2020-12-29 ENCOUNTER — Ambulatory Visit: Payer: Self-pay | Attending: Family Medicine | Admitting: Family Medicine

## 2020-12-29 ENCOUNTER — Other Ambulatory Visit: Payer: Self-pay

## 2020-12-29 ENCOUNTER — Encounter: Payer: Self-pay | Admitting: Family Medicine

## 2020-12-29 ENCOUNTER — Telehealth: Payer: Self-pay

## 2020-12-29 VITALS — BP 122/74 | HR 82 | Ht 73.0 in | Wt 169.6 lb

## 2020-12-29 DIAGNOSIS — K0889 Other specified disorders of teeth and supporting structures: Secondary | ICD-10-CM

## 2020-12-29 DIAGNOSIS — R399 Unspecified symptoms and signs involving the genitourinary system: Secondary | ICD-10-CM

## 2020-12-29 DIAGNOSIS — G47419 Narcolepsy without cataplexy: Secondary | ICD-10-CM

## 2020-12-29 DIAGNOSIS — Z85528 Personal history of other malignant neoplasm of kidney: Secondary | ICD-10-CM | POA: Insufficient documentation

## 2020-12-29 MED ORDER — AMOXICILLIN 500 MG PO CAPS
500.0000 mg | ORAL_CAPSULE | Freq: Three times a day (TID) | ORAL | 0 refills | Status: DC
  Filled 2020-12-29: qty 30, 10d supply, fill #0

## 2020-12-29 MED ORDER — MODAFINIL 200 MG PO TABS: 200.0000 mg | ORAL_TABLET | Freq: Every day | ORAL | 2 refills | Status: DC

## 2020-12-29 MED ORDER — TAMSULOSIN HCL 0.4 MG PO CAPS
0.4000 mg | ORAL_CAPSULE | Freq: Every day | ORAL | 3 refills | Status: DC
  Filled 2020-12-29: qty 30, 30d supply, fill #0

## 2020-12-29 MED ORDER — TAMSULOSIN HCL 0.4 MG PO CAPS
0.4000 mg | ORAL_CAPSULE | Freq: Every day | ORAL | 3 refills | Status: DC
Start: 2020-12-29 — End: 2020-12-29

## 2020-12-29 MED ORDER — MODAFINIL 200 MG PO TABS: 200.0000 mg | ORAL_TABLET | Freq: Every day | ORAL | 0 refills | Status: DC

## 2020-12-29 MED ORDER — AMOXICILLIN 500 MG PO CAPS: 500.0000 mg | ORAL_CAPSULE | Freq: Three times a day (TID) | ORAL | 0 refills | Status: DC

## 2020-12-29 NOTE — Telephone Encounter (Signed)
This CM spoke to Marvin Mckee, [name] regarding patient's need for provigil as [facility] does not carry this medication.  She said there there are no patient assistance programs available for provigil.  She was able to provide a good rx coupon for [facility] with cost for patient $28. [facility] pharmacy was able to do a one time free fill for the amoxicillin and flomax.   This CM met with the patient and explained above.  He was under the impression that the medication he had been receiving was free not funded through an assistance program. He called his sister and she agreed to pay for the provigil.  He then requested it be sent to [facility] at [facility].  He then said that he just received a housing voucher for $650. The [employer] has a house for him for $650 and he is just waiting on a final inspection.  He will not have to pay any rent out of pocket and [employer] will provide furniture.  He currently stays in an abandoned building.  If approved, his address will be [address] [place].  He has not applied for medicaid and is also asking about unemployment.  He was agreeable to having this CM place a referral to [employer] [place] for assistance.  Informed that if they are not able to assist, they may be able to provide resources for him to address these issues.  Referral then sent to Marvin Mckee, attorney/[employer].

## 2020-12-29 NOTE — Progress Notes (Signed)
Having dental pain, and headache.
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
## 2020-12-29 NOTE — Telephone Encounter (Signed)
Prescription sent to [facility].

## 2020-12-30 LAB — PSA, TOTAL AND FREE
PSA, Free Pct: 30 %
PSA, Free: 0.15 ng/mL
Prostate Specific Ag, Serum: 0.5 ng/mL (ref 0.0–4.0)

## 2020-12-30 LAB — BASIC METABOLIC PANEL
BUN/Creatinine Ratio: 12 (ref 9–20)
BUN: 14 mg/dL (ref 6–24)
CO2: 23 mmol/L (ref 20–29)
Calcium: 8.4 mg/dL — ABNORMAL LOW (ref 8.7–10.2)
Chloride: 107 mmol/L — ABNORMAL HIGH (ref 96–106)
Creatinine, Ser: 1.18 mg/dL (ref 0.76–1.27)
Glucose: 100 mg/dL — ABNORMAL HIGH (ref 65–99)
Potassium: 4.4 mmol/L (ref 3.5–5.2)
Sodium: 143 mmol/L (ref 134–144)
eGFR: 74 mL/min/{1.73_m2} (ref >59)

## 2020-12-31 ENCOUNTER — Telehealth: Payer: Self-pay

## 2020-12-31 NOTE — Telephone Encounter (Signed)
Pt was called and a VM was left informing patient of lab results. 

## 2020-12-31 NOTE — Telephone Encounter (Signed)
-----   Message from Charlott Rakes, MD sent at 12/30/2020  2:55 PM EDT ----- Labs are stable.

## 2021-01-03 ENCOUNTER — Other Ambulatory Visit: Payer: Self-pay

## 2021-01-03 DIAGNOSIS — Z85528 Personal history of other malignant neoplasm of kidney: Secondary | ICD-10-CM

## 2021-01-03 NOTE — Progress Notes (Unsigned)
Order needed to be changed per radiology. Mariann Laster was the contact person.

## 2021-01-04 ENCOUNTER — Ambulatory Visit (HOSPITAL_COMMUNITY): Admission: RE | Admit: 2021-01-04 | Payer: Self-pay | Source: Ambulatory Visit

## 2021-06-28 ENCOUNTER — Other Ambulatory Visit: Payer: Self-pay
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
## 2021-10-25 ENCOUNTER — Ambulatory Visit: Payer: Self-pay

## 2021-10-25 NOTE — Telephone Encounter (Signed)
Called patient and left and VM picked up.   If patient returns phone call CRM has been created.

## 2021-10-25 NOTE — Telephone Encounter (Signed)
Pt called and states that when he urinates he feels like he stops and is done but he still has to urinate / please advise / pt called from [phone]/ pt has an appt 2.16.23     Chief Complaint: Decreased urinary stream Symptoms: Concerned about "kidney cancer and my narcolepsy." Frequency:  Pertinent Negatives: Patient denies  Disposition: [] ED /[] Urgent Care (no appt availability in office) / [] Appointment(In office/virtual)/ []  [place] [facility]/ [] Home Care/ [] Refused Recommended Disposition /[] [place] [facility]/ [x]  Follow-up with PCP Additional Notes: Pt. Asking to be seen  sooner than 11/10/21 appointment. Please advise pt.  Answer Assessment - Initial Assessment Questions 1. SYMPTOM: "What's the main symptom you're concerned about?" (e.g., frequency, incontinence)     Decreased urinary flow/stream 2. ONSET: "When did the  symptoms  start?"     For awhile 3. PAIN: "Is there any pain?" If Yes, ask: "How bad is it?" (Scale: 1-10; mild, moderate, severe)     No pain 4. CAUSE: "What do you think is causing the symptoms?"     Out of Flomax 5. OTHER SYMPTOMS: "Do you have any other symptoms?" (e.g., fever, flank pain, blood in urine, pain with urination)     Decreased stream 6. PREGNANCY: "Is there any chance you are pregnant?" "When was your last menstrual period?"     N/a  Protocols used: Urinary Symptoms-A-AH

## 2021-11-10 ENCOUNTER — Ambulatory Visit: Payer: Self-pay | Admitting: Physician Assistant

## 2022-01-18 ENCOUNTER — Ambulatory Visit: Payer: Self-pay | Admitting: *Deleted

## 2022-01-18 NOTE — Telephone Encounter (Signed)
Per agent: ?"Pt called in wanting to be seen due to coughing up yellow mucus as well has having issues while urinating, no open appts until 5/18 and pt needed advice." ? ? ?Chief Complaint: Urinary Incontinence ?Symptoms: "Pee on myself when I'm done peeing I leak." H/O renal cell cancer. Dysuria at times. Also reports cough, yellowish mucous. sSates had fever but not presently. States dizzy with standing at times ?Frequency: Incontinence x 1 month, cough "Some time" ?Pertinent Negatives: Patient denies  ? ?Disposition: '[]'$ ED /'[]'$ Urgent Care (no appt availability in office) / '[x]'$ Appointment(In office/virtual)/ '[]'$  [place] [facility]/ '[]'$ Home Care/ '[]'$ Refused Recommended Disposition /'[]'$ [facility]/ '[]'$  Follow-up with PCP ?Additional Notes: Pt evasive historian. Appt secured with Cari at [facility] for tomorrow.   ?Also would like information on [employer]. ? ?Reason for Disposition ? [1] Can't control passage of urine (i.e., urinary incontinence) AND [2] new-onset (< 2 weeks) or worsening ? ?Answer Assessment - Initial Assessment Questions ?1. SYMPTOM: "What's the main symptom you're concerned about?" (e.g., frequency, incontinence) ?    "Peeing, leak on myself after I finish peeing." ?2. ONSET: "When did the   start?" ?    1 month ago ?3. PAIN: "Is there any pain?" If Yes, ask: "How bad is it?" (Scale: 1-10; mild, moderate, severe) ?    With urination at times, not always ?4. CAUSE: "What do you think is causing the symptoms?" ?    Unsure ?5. OTHER SYMPTOMS: "Do you have any other symptoms?" (e.g., fever, flank pain, blood in urine, pain with urination) ?    Cough, had fever, dizzy when standing at times. ? ?Protocols used: Urinary Symptoms-A-AH ? ?

## 2022-01-19 ENCOUNTER — Encounter: Payer: Self-pay | Admitting: Physician Assistant

## 2022-01-19 ENCOUNTER — Ambulatory Visit (INDEPENDENT_AMBULATORY_CARE_PROVIDER_SITE_OTHER): Payer: Self-pay | Admitting: Physician Assistant

## 2022-01-19 VITALS — BP 134/85 | HR 100 | Temp 98.2°F | Resp 18 | Ht 73.0 in | Wt 154.0 lb

## 2022-01-19 DIAGNOSIS — R399 Unspecified symptoms and signs involving the genitourinary system: Secondary | ICD-10-CM

## 2022-01-19 DIAGNOSIS — Z85528 Personal history of other malignant neoplasm of kidney: Secondary | ICD-10-CM

## 2022-01-19 DIAGNOSIS — N39498 Other specified urinary incontinence: Secondary | ICD-10-CM

## 2022-01-19 DIAGNOSIS — R400 Somnolence: Secondary | ICD-10-CM

## 2022-01-19 DIAGNOSIS — R051 Acute cough: Secondary | ICD-10-CM

## 2022-01-19 DIAGNOSIS — R55 Syncope and collapse: Secondary | ICD-10-CM

## 2022-01-19 DIAGNOSIS — F1410 Cocaine abuse, uncomplicated: Secondary | ICD-10-CM

## 2022-01-19 DIAGNOSIS — I10 Essential (primary) hypertension: Secondary | ICD-10-CM

## 2022-01-19 LAB — POCT URINALYSIS DIP (CLINITEK)
Bilirubin, UA: NEGATIVE
Glucose, UA: NEGATIVE mg/dL
Nitrite, UA: NEGATIVE
POC PROTEIN,UA: 100 — AB
Spec Grav, UA: 1.02 (ref 1.010–1.025)
Urobilinogen, UA: 2 E.U./dL — AB
pH, UA: 5.5 (ref 5.0–8.0)

## 2022-01-19 MED ORDER — TAMSULOSIN HCL 0.4 MG PO CAPS: 0.4000 mg | ORAL_CAPSULE | Freq: Every day | ORAL | 3 refills | Status: DC

## 2022-01-19 MED ORDER — BENZONATATE 200 MG PO CAPS: 200.0000 mg | ORAL_CAPSULE | Freq: Two times a day (BID) | ORAL | 0 refills | Status: DC | PRN
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
## 2022-01-19 NOTE — Patient Instructions (Signed)
You are going to restart taking your Flomax once daily. ? ?Sent a prescription for Tessalon Perles to help you with your cough. ? ?Please let us know if we can help you with your cocaine use.  I strongly encourage you to consider detox and a treatment center. ? ?We will call you with today's lab results. ? ?Kennieth Rad, PA-C ?Physician Assistant ?[place] ?[email or web address] ? ? ?Finding Treatment for Addiction ?Addiction is a complex disease of the brain that causes an uncontrollable (compulsive) need for: ?A substance. This includes alcohol, drugs, or prescription medicines, such as painkillers. ?An activity or behavior, such as gambling or shopping. ?What are the risks? ?Addiction is a progressive disease. Without treatment, addiction can get worse. Living with addiction puts you at higher risk for injury, poor health, loss of employment, loss of money, and even death. ?Addiction changes the way your brain works. Because of this change: ?The need for the medicine, drug, or activity can become so strong that you think about it all the time. ?Getting more and more of your addiction becomes the most important thing to you. ?You may find yourself leaving other activities and relationships to pursue your addiction. ?You can become physically dependent on a substance. ?Your health, behavior, emotions, and relationships can change for the worse. ?How to select a treatment program ?Know your options ?There may be options for treatment programs and plans based on your addiction, condition, needs, and preferences. No single treatment is right for everyone. ?Treatment programs can be: ?Outpatient. You live at home and go to work or school, but you go to a clinic for treatment. ?Inpatient. You live and sleep at the program facility during treatment. ?Programs may include: ?Medicine. You may need medicine to treat the addiction itself or to treat anxiety or  depression. ?Counseling and behavior therapy. This can help individuals and families behave in healthier ways and relate more effectively. ?Support groups. Confidential group therapy, such as a 12-step program, can help individuals and families during treatment and recovery. ?A combination of education, counseling, and a 12-step, spirituality-based approach. ? ?Think about your needs ?Think about your individual requirements when selecting a treatment program. Ask about: ?The overall approach to treatment. ?Some programs are strictly 12-step programs. Some have a more flexible approach. ?Programs may differ in length of stay, setting, and size. ?Some programs include your family in your treatment plan. Support may be offered to them throughout the treatment process, as well as instructions for them when you are discharged. ?You may continue to receive support after you have left the program. ?The types of medical services that are offered. Find out if the program: ?Offers specific treatment for your particular addiction. ?Meets all of your needs, including physical and cultural needs. ?Includes any medicines you might need. ?Offers mental health counseling as part of your treatment. ?Offers the 12-step meetings at the center, or if transport is available for you to attend meetings at other locations. ?The cost and types of insurance that are accepted. ?Some programs are sponsored by the government. They support people in treatment who do not have private insurance. ?If you do not have insurance, or if you choose to attend a program that does not accept your insurance, call the treatment center. Tell them your financial needs and ask whether a payment plan can be set up. ?There are also organizations that will help you find the resources for treatment. You can find them online by searching for "treatment for addiction." ?If  the program is certified by the appropriate government agency. ?Follow these instructions at  home: ?Find supportive people who will help you stay away from your addiction and stay sober. ?Do not use the substance or engage in the activity. ?If you have been through treatment: ?Follow your plan. The plan is usually developed by you and your health care provider during treatment. These discussions are confidential. ?Go to meetings with other people in recovery. ?Avoid people, situations, and things that lead you to do the things you are addicted to (triggers). ?Where to find more information ?Recovered: recovered.org ?Substance Abuse and Mental Health Services Administration [facility]): findtreatment.SamedayNews.com.cy ?[employer] on Problem Gambling: www.ncpgambling.org ?Get help right away if: ?You have serious thoughts about hurting yourself or others. ?Get help right away if you feel like you may hurt yourself or others, or have thoughts about taking your own life. Go to your nearest emergency room or: ?Call 911. ?Call the [facility] at 386-729-8270 or 988 in the [place] This is open 24 hours a day. ?Text the Crisis Text Line at 437-696-5354. ?Summary ?Addiction changes the way your brain works. These changes cause a desire to repeat and increase the use of the substance or behavior. ?Addiction is a progressive disease. Without treatment, addiction can get worse. Living with addiction puts you at higher risk for injury, poor health, loss of employment, loss of money, and even death. ?There may be options for treatment programs and plans based on your addiction, condition, needs, and preferences. No single treatment is right for everyone. ?Your health care provider can help you find the right treatment. These discussions are confidential. ?This information is not intended to replace advice given to you by your health care provider. Make sure you discuss any questions you have with your health care provider. ?Document Revised: 04/07/2021 Document Reviewed: 03/16/2021 ?Elsevier Patient  Education ? [employer]. ? ?

## 2022-01-20 LAB — LIPID PANEL
Chol/HDL Ratio: 2.7 ratio (ref 0.0–5.0)
Cholesterol, Total: 175 mg/dL (ref 100–199)
HDL: 66 mg/dL (ref >39)
LDL Chol Calc (NIH): 97 mg/dL (ref 0–99)
Triglycerides: 62 mg/dL (ref 0–149)
VLDL Cholesterol Cal: 12 mg/dL (ref 5–40)

## 2022-01-20 LAB — CBC WITH DIFFERENTIAL/PLATELET
Basophils Absolute: 0.1 10*3/uL (ref 0.0–0.2)
Basos: 0 %
EOS (ABSOLUTE): 0 10*3/uL (ref 0.0–0.4)
Eos: 0 %
Hematocrit: 44.8 % (ref 37.5–51.0)
Hemoglobin: 14.9 g/dL (ref 13.0–17.7)
Immature Grans (Abs): 0.2 10*3/uL — ABNORMAL HIGH (ref 0.0–0.1)
Immature Granulocytes: 1 %
Lymphocytes Absolute: 2.1 10*3/uL (ref 0.7–3.1)
Lymphs: 11 %
MCH: 27.9 pg (ref 26.6–33.0)
MCHC: 33.3 g/dL (ref 31.5–35.7)
MCV: 84 fL (ref 79–97)
Monocytes Absolute: 1.5 10*3/uL — ABNORMAL HIGH (ref 0.1–0.9)
Monocytes: 8 %
Neutrophils Absolute: 15.1 10*3/uL — ABNORMAL HIGH (ref 1.4–7.0)
Neutrophils: 80 %
Platelets: 273 10*3/uL (ref 150–450)
RBC: 5.34 x10E6/uL (ref 4.14–5.80)
RDW: 12.6 % (ref 11.6–15.4)
WBC: 19 10*3/uL — ABNORMAL HIGH (ref 3.4–10.8)

## 2022-01-20 LAB — COMP. METABOLIC PANEL (12)
AST: 26 IU/L (ref 0–40)
Albumin/Globulin Ratio: 1.3 (ref 1.2–2.2)
Albumin: 4.1 g/dL (ref 3.8–4.9)
Alkaline Phosphatase: 96 IU/L (ref 44–121)
BUN/Creatinine Ratio: 10 (ref 9–20)
BUN: 10 mg/dL (ref 6–24)
Bilirubin Total: 0.4 mg/dL (ref 0.0–1.2)
Calcium: 9.1 mg/dL (ref 8.7–10.2)
Chloride: 93 mmol/L — ABNORMAL LOW (ref 96–106)
Creatinine, Ser: 1.04 mg/dL (ref 0.76–1.27)
Globulin, Total: 3.2 g/dL (ref 1.5–4.5)
Glucose: 77 mg/dL (ref 70–99)
Potassium: 4.7 mmol/L (ref 3.5–5.2)
Sodium: 133 mmol/L — ABNORMAL LOW (ref 134–144)
Total Protein: 7.3 g/dL (ref 6.0–8.5)
eGFR: 86 mL/min/{1.73_m2} (ref >59)

## 2022-01-20 LAB — TSH: TSH: 0.472 u[IU]/mL (ref 0.450–4.500)

## 2022-01-20 LAB — PSA: Prostate Specific Ag, Serum: 0.6 ng/mL (ref 0.0–4.0)

## 2022-01-21 ENCOUNTER — Inpatient Hospital Stay (HOSPITAL_COMMUNITY)
Admission: EM | Admit: 2022-01-21 | Discharge: 2022-01-24 | DRG: 871 | Disposition: A | Discharge status: Home / Self Care | Payer: Self-pay | Attending: Internal Medicine | Admitting: Internal Medicine

## 2022-01-21 ENCOUNTER — Emergency Department (HOSPITAL_COMMUNITY): Payer: Self-pay

## 2022-01-21 ENCOUNTER — Encounter (HOSPITAL_COMMUNITY): Payer: Self-pay | Admitting: Emergency Medicine

## 2022-01-21 DIAGNOSIS — Z91128 Patient's intentional underdosing of medication regimen for other reason: Secondary | ICD-10-CM

## 2022-01-21 DIAGNOSIS — N401 Enlarged prostate with lower urinary tract symptoms: Secondary | ICD-10-CM | POA: Diagnosis present

## 2022-01-21 DIAGNOSIS — Z905 Acquired absence of kidney: Secondary | ICD-10-CM

## 2022-01-21 DIAGNOSIS — R350 Frequency of micturition: Secondary | ICD-10-CM | POA: Diagnosis present

## 2022-01-21 DIAGNOSIS — N39498 Other specified urinary incontinence: Secondary | ICD-10-CM

## 2022-01-21 DIAGNOSIS — R300 Dysuria: Secondary | ICD-10-CM | POA: Diagnosis present

## 2022-01-21 DIAGNOSIS — Z66 Do not resuscitate: Secondary | ICD-10-CM | POA: Diagnosis present

## 2022-01-21 DIAGNOSIS — Y92009 Unspecified place in unspecified non-institutional (private) residence as the place of occurrence of the external cause: Secondary | ICD-10-CM

## 2022-01-21 DIAGNOSIS — F1410 Cocaine abuse, uncomplicated: Secondary | ICD-10-CM | POA: Diagnosis present

## 2022-01-21 DIAGNOSIS — T43696A Underdosing of other psychostimulants, initial encounter: Secondary | ICD-10-CM | POA: Diagnosis present

## 2022-01-21 DIAGNOSIS — R32 Unspecified urinary incontinence: Secondary | ICD-10-CM | POA: Diagnosis present

## 2022-01-21 DIAGNOSIS — R066 Hiccough: Secondary | ICD-10-CM | POA: Diagnosis not present

## 2022-01-21 DIAGNOSIS — Z5900 Homelessness unspecified: Secondary | ICD-10-CM

## 2022-01-21 DIAGNOSIS — Z20822 Contact with and (suspected) exposure to covid-19: Secondary | ICD-10-CM | POA: Diagnosis present

## 2022-01-21 DIAGNOSIS — Z85528 Personal history of other malignant neoplasm of kidney: Secondary | ICD-10-CM

## 2022-01-21 DIAGNOSIS — G47419 Narcolepsy without cataplexy: Secondary | ICD-10-CM | POA: Diagnosis present

## 2022-01-21 DIAGNOSIS — A419 Sepsis, unspecified organism: Principal | ICD-10-CM | POA: Diagnosis present

## 2022-01-21 DIAGNOSIS — I10 Essential (primary) hypertension: Secondary | ICD-10-CM | POA: Diagnosis present

## 2022-01-21 DIAGNOSIS — F17210 Nicotine dependence, cigarettes, uncomplicated: Secondary | ICD-10-CM | POA: Diagnosis present

## 2022-01-21 DIAGNOSIS — J189 Pneumonia, unspecified organism: Secondary | ICD-10-CM | POA: Diagnosis present

## 2022-01-21 DIAGNOSIS — R051 Acute cough: Secondary | ICD-10-CM

## 2022-01-21 DIAGNOSIS — Z79899 Other long term (current) drug therapy: Secondary | ICD-10-CM

## 2022-01-21 LAB — COMPREHENSIVE METABOLIC PANEL
ALT: 43 U/L (ref 0–44)
AST: 30 U/L (ref 15–41)
Albumin: 2.8 g/dL — ABNORMAL LOW (ref 3.5–5.0)
Alkaline Phosphatase: 70 U/L (ref 38–126)
Anion gap: 9 (ref 5–15)
BUN: 7 mg/dL (ref 6–20)
CO2: 26 mmol/L (ref 22–32)
Calcium: 8.5 mg/dL — ABNORMAL LOW (ref 8.9–10.3)
Chloride: 99 mmol/L (ref 98–111)
Creatinine, Ser: 1 mg/dL (ref 0.61–1.24)
GFR, Estimated: >60 mL/min (ref >60)
Glucose, Bld: 117 mg/dL — ABNORMAL HIGH (ref 70–99)
Potassium: 4.3 mmol/L (ref 3.5–5.1)
Sodium: 134 mmol/L — ABNORMAL LOW (ref 135–145)
Total Bilirubin: 0.3 mg/dL (ref 0.3–1.2)
Total Protein: 7.5 g/dL (ref 6.5–8.1)

## 2022-01-21 LAB — URINALYSIS, ROUTINE W REFLEX MICROSCOPIC
Bacteria, UA: NONE SEEN
Bilirubin Urine: NEGATIVE
Glucose, UA: NEGATIVE mg/dL
Hgb urine dipstick: NEGATIVE
Ketones, ur: NEGATIVE mg/dL
Leukocytes,Ua: NEGATIVE
Nitrite: NEGATIVE
Protein, ur: 100 mg/dL — AB
Specific Gravity, Urine: 1.016 (ref 1.005–1.030)
pH: 7 (ref 5.0–8.0)

## 2022-01-21 LAB — RAPID URINE DRUG SCREEN, HOSP PERFORMED
Amphetamines: NOT DETECTED
Barbiturates: NOT DETECTED
Benzodiazepines: NOT DETECTED
Cocaine: POSITIVE — AB
Opiates: NOT DETECTED
Tetrahydrocannabinol: NOT DETECTED

## 2022-01-21 LAB — URINE CULTURE

## 2022-01-21 LAB — CBC
HCT: 42.3 % (ref 39.0–52.0)
Hemoglobin: 13.8 g/dL (ref 13.0–17.0)
MCH: 27.4 pg (ref 26.0–34.0)
MCHC: 32.6 g/dL (ref 30.0–36.0)
MCV: 83.9 fL (ref 80.0–100.0)
Platelets: 317 10*3/uL (ref 150–400)
RBC: 5.04 MIL/uL (ref 4.22–5.81)
RDW: 13.1 % (ref 11.5–15.5)
WBC: 24.9 10*3/uL — ABNORMAL HIGH (ref 4.0–10.5)
nRBC: 0 % (ref 0.0–0.2)

## 2022-01-21 LAB — LACTIC ACID, PLASMA: Lactic Acid, Venous: 1.6 mmol/L (ref 0.5–1.9)

## 2022-01-21 MED ORDER — AMPICILLIN-SULBACTAM SODIUM 1.5 (1-0.5) G IJ SOLR
1.5000 g | Freq: Once | INTRAMUSCULAR | Status: DC
Start: 2022-01-22 — End: 2022-01-22
  Filled 2022-01-21: qty 4

## 2022-01-21 MED ORDER — SODIUM CHLORIDE 0.9 % IV BOLUS
1000.0000 mL | Freq: Once | INTRAVENOUS | Status: AC
  Administered 2022-01-21: 1000 mL via INTRAVENOUS

## 2022-01-21 MED ORDER — IOHEXOL 300 MG/ML  SOLN
100.0000 mL | Freq: Once | INTRAMUSCULAR | Status: AC | PRN
  Administered 2022-01-21: 100 mL via INTRAVENOUS

## 2022-01-21 MED ORDER — SODIUM CHLORIDE 0.9 % IV SOLN: 100.0000 mg | Freq: Two times a day (BID) | INTRAVENOUS | Status: DC

## 2022-01-21 MED ORDER — KETOROLAC TROMETHAMINE 15 MG/ML IJ SOLN
15.0000 mg | Freq: Once | INTRAMUSCULAR | Status: AC
  Administered 2022-01-21: 15 mg via INTRAVENOUS
  Filled 2022-01-21: qty 1

## 2022-01-21 MED ORDER — MORPHINE SULFATE (PF) 4 MG/ML IV SOLN
4.0000 mg | Freq: Once | INTRAVENOUS | Status: AC
Start: 2022-01-21 — End: 2022-01-21
  Administered 2022-01-21: 4 mg via INTRAVENOUS
  Filled 2022-01-21: qty 1

## 2022-01-21 NOTE — ED Triage Notes (Addendum)
Patient BIB [facility] from home c/o left flank pain that radiates into left UQ.  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  Patient seen by PCP on Thursday with concerns for UTI.  Today, the pain became unbearable.   ? ?170/100 ?150 mcg fentanyl ?'4mg'$  zofran ?135/78 ?100 HR ?95% RA ?18 G right AC ?

## 2022-01-21 NOTE — ED Notes (Signed)
Patient given urinal per request.

## 2022-01-21 NOTE — ED Provider Notes (Addendum)
?[facility] ?Provider Note ? ? ?CSN: [id number] ?Arrival date & time: 01/21/22  1956 ? ?  ? ?History ? ?No chief complaint on file. ? ? ?Marvin Mckee is a 56 y.o. male. ? 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  Patient admits to left-sided flank pain, left-sided abdominal pain that radiates to the left chest, epigastric abdominal pain, nausea without vomiting, cough, worse, and chills.  Denies IV drug use.  Denies dysuria hematuria, or difficulty urinating. ? ?The history is provided by the patient. No language interpreter was used.  ? ?  ? ?Home Medications ?Prior to Admission medications   ?Medication Sig Start Date End Date Taking? Authorizing Provider  ?benzonatate (TESSALON) 200 MG capsule Take 1 capsule (200 mg total) by mouth 2 (two) times daily as needed for cough. 01/19/22   Mayers, Cari S, PA-C  ?tamsulosin (FLOMAX) 0.4 MG CAPS capsule Take 1 capsule (0.4 mg total) by mouth daily. 01/19/22   Mayers, Loraine Grip, PA-C  ?   ? ?Allergies    ?Patient has no known allergies.   ? ?Review of Systems   ?Review of Systems  ?Constitutional:  Positive for chills and fever.  ?HENT:  Negative for ear pain and sore throat.   ?Eyes:  Negative for pain and visual disturbance.  ?Respiratory:  Positive for cough. Negative for shortness of breath.   ?Cardiovascular:  Positive for chest pain. Negative for palpitations.  ?Gastrointestinal:  Positive for abdominal pain and nausea. Negative for vomiting.  ?Genitourinary:  Positive for flank pain. Negative for dysuria and hematuria.  ?Musculoskeletal:  Positive for myalgias. Negative for arthralgias and back pain.  ?Skin:  Negative for color change and rash.  ?Neurological:  Negative for seizures and syncope.  ?All other systems reviewed and are negative. ? ?Physical Exam ?Updated Vital Signs ?BP 122/89 (BP Location: Left  Arm)   Pulse 84   Temp (!) 102.1 ?F (38.9 ?C) (Oral)   Resp 16   Ht '6\' 1"'$  (1.854 m)   Wt 70.8 kg   SpO2 97%   BMI 20.58 kg/m?  ?Physical Exam ?Vitals and nursing note reviewed.  ?Constitutional:   ?   General: He is not in acute distress. ?   Appearance: He is well-developed.  ?HENT:  ?   Head: Normocephalic and atraumatic.  ?Eyes:  ?   Conjunctiva/sclera: Conjunctivae normal.  ?Cardiovascular:  ?   Rate and Rhythm: Regular rhythm. Tachycardia present.  ?   Heart sounds: No murmur heard. ?Pulmonary:  ?   Effort: Pulmonary effort is normal. Tachypnea present. No respiratory distress.  ?   Breath sounds: Normal breath sounds.  ?Abdominal:  ?   Palpations: Abdomen is soft.  ?   Tenderness: There is generalized abdominal tenderness. There is guarding. There is no rebound.  ?Musculoskeletal:     ?   General: No swelling.  ?   Cervical back: Neck supple.  ?Skin: ?   General: Skin is warm and dry.  ?   Capillary Refill: Capillary refill takes less than 2 seconds.  ?Neurological:  ?   Mental Status: He is alert.  ?Psychiatric:     ?   Mood and Affect: Mood normal.  ? ? ?ED Results / Procedures / Treatments   ?Labs ?(all labs ordered are listed, but only abnormal results are displayed) ?Labs Reviewed  ?URINALYSIS, ROUTINE W REFLEX MICROSCOPIC - Abnormal;  Notable for the following components:  ?    Result Value  ? Protein, ur 100 (*)   ? All other components within normal limits  ?CBC - Abnormal; Notable for the following components:  ? WBC 24.9 (*)   ? All other components within normal limits  ?COMPREHENSIVE METABOLIC PANEL - Abnormal; Notable for the following components:  ? Sodium 134 (*)   ? Glucose, Bld 117 (*)   ? Calcium 8.5 (*)   ? Albumin 2.8 (*)   ? All other components within normal limits  ?RAPID URINE DRUG SCREEN, HOSP PERFORMED - Abnormal; Notable for the following components:  ? Cocaine POSITIVE (*)   ? All other components within normal limits  ?URINE CULTURE  ?CULTURE, BLOOD (ROUTINE X 2)  ?CULTURE,  BLOOD (ROUTINE X 2)  ?LACTIC ACID, PLASMA  ?LACTIC ACID, PLASMA  ? ? ?EKG ?None ? ?Radiology ?CT ABDOMEN PELVIS W CONTRAST ? ?Result Date: 01/21/2022 ?CLINICAL DATA:  Abdominal pain, acute, nonlocalized. EXAM: CT ABDOMEN AND PELVIS WITH CONTRAST TECHNIQUE: Multidetector CT imaging of the abdomen and pelvis was performed using the standard protocol following bolus administration of intravenous contrast. RADIATION DOSE REDUCTION: This exam was performed according to the departmental dose-optimization program which includes automated exposure control, adjustment of the mA and/or kV according to patient size and/or use of iterative reconstruction technique. CONTRAST:  135m OMNIPAQUE IOHEXOL 300 MG/ML  SOLN COMPARISON:  03/23/2014. FINDINGS: Lower chest: Patchy infiltrates and consolidation are noted in the left lower lobe, suspicious for pneumonia. There is a 3 mm nodule in the right lower lobe, axial image 1, unchanged from 2015. Hepatobiliary: No focal liver abnormality is seen. No gallstones, gallbladder wall thickening, or biliary dilatation. Pancreas: Unremarkable. No pancreatic ductal dilatation or surrounding inflammatory changes. Spleen: Normal in size without focal abnormality. Adrenals/Urinary Tract: The adrenal glands are within normal limits. No renal calculus or hydronephrosis bilaterally. The kidneys enhance symmetrically. A complex hypodensity is present in the lower pole of the right kidney measuring 1.1 cm. Bladder is unremarkable. Stomach/Bowel: Stomach is within normal limits. A normal appendix is seen in the mid right abdomen along the inferior aspect of the liver. No evidence of bowel wall thickening, distention, or inflammatory changes. No free air or pneumatosis. Vascular/Lymphatic: Aortic atherosclerosis. No enlarged abdominal or pelvic lymph nodes. Reproductive: Prostate is unremarkable. Other: No abdominal wall hernia or abnormality. No abdominopelvic ascites. Musculoskeletal: Mild  retrolisthesis and degenerative changes at L5-S1. No acute osseous abnormality. IMPRESSION: 1. Patchy airspace disease and consolidation in the left lower lobe, suspicious for pneumonia. 2. No acute process in the abdomen and pelvis. Electronically Signed   By: LBrett FairyM.D.   On: 01/21/2022 22:52  ? ?DG Chest Portable 1 View ? ?Result Date: 01/21/2022 ?CLINICAL DATA:  Cough and possible sepsis EXAM: PORTABLE CHEST 1 VIEW COMPARISON:  02/21/2019 FINDINGS: Cardiac shadow is within normal limits. The lungs are well aerated bilaterally. Diffuse left basilar infiltrate is noted consistent with acute pneumonia. No bony abnormality is seen. IMPRESSION: Left basilar pneumonia. Electronically Signed   By: MInez CatalinaM.D.   On: 01/21/2022 21:54   ? ?Procedures ?.Marland Kitchenritical Care ?Performed by: GLianne Cure DO ?Authorized by: GLianne Cure DO  ? ?Critical care provider statement:  ?  Critical care time (minutes):  35 ?  Critical care was necessary to treat or prevent imminent or life-threatening deterioration of the following conditions:  Sepsis ?  Critical care was time spent personally by me on the following activities:  Development of treatment plan with patient or surrogate, discussions with consultants, evaluation of patient's response to treatment, examination of patient, ordering and review of laboratory studies, ordering and review of radiographic studies, ordering and performing treatments and interventions, pulse oximetry, re-evaluation of patient's condition and review of old charts  ? ? ?Medications Ordered in ED ?Medications  ?ampicillin-sulbactam (UNASYN) 1.5 g in sodium chloride 0.9 % 100 mL IVPB (has no administration in time range)  ?doxycycline (VIBRAMYCIN) 100 mg in sodium chloride 0.9 % 250 mL IVPB (has no administration in time range)  ?sodium chloride 0.9 % bolus 1,000 mL (0 mLs Intravenous Stopped 01/21/22 2229)  ?ketorolac (TORADOL) 15 MG/ML injection 15 mg (15 mg Intravenous Given 01/21/22 2050)   ?morphine (PF) 4 MG/ML injection 4 mg (4 mg Intravenous Given 01/21/22 2145)  ?iohexol (OMNIPAQUE) 300 MG/ML solution 100 mL (100 mLs Intravenous Contrast Given 01/21/22 2226)  ? ? ?ED Course/ Medical Decision Making/ A&P

## 2022-01-21 NOTE — ED Notes (Signed)
Patient currently resting comfortably with his head covered by the blanket.  Patient respirations noted to be even and unlabored.  Patient in NAD at this time. ?

## 2022-01-22 ENCOUNTER — Other Ambulatory Visit: Payer: Self-pay

## 2022-01-22 DIAGNOSIS — J189 Pneumonia, unspecified organism: Secondary | ICD-10-CM | POA: Diagnosis present

## 2022-01-22 DIAGNOSIS — F1410 Cocaine abuse, uncomplicated: Secondary | ICD-10-CM

## 2022-01-22 DIAGNOSIS — I10 Essential (primary) hypertension: Secondary | ICD-10-CM

## 2022-01-22 DIAGNOSIS — A419 Sepsis, unspecified organism: Secondary | ICD-10-CM | POA: Diagnosis present

## 2022-01-22 LAB — HEPATIC FUNCTION PANEL
ALT: 30 U/L (ref 0–44)
AST: 23 U/L (ref 15–41)
Albumin: 2.3 g/dL — ABNORMAL LOW (ref 3.5–5.0)
Alkaline Phosphatase: 67 U/L (ref 38–126)
Bilirubin, Direct: <0.1 mg/dL (ref 0.0–0.2)
Total Bilirubin: 0.3 mg/dL (ref 0.3–1.2)
Total Protein: 6.4 g/dL — ABNORMAL LOW (ref 6.5–8.1)

## 2022-01-22 LAB — RESP PANEL BY RT-PCR (FLU A&B, COVID) ARPGX2
Influenza A by PCR: NEGATIVE
Influenza B by PCR: NEGATIVE
SARS Coronavirus 2 by RT PCR: NEGATIVE

## 2022-01-22 LAB — LACTIC ACID, PLASMA: Lactic Acid, Venous: 1.2 mmol/L (ref 0.5–1.9)

## 2022-01-22 LAB — COMPREHENSIVE METABOLIC PANEL
ALT: 31 U/L (ref 0–44)
AST: 25 U/L (ref 15–41)
Albumin: 2.3 g/dL — ABNORMAL LOW (ref 3.5–5.0)
Alkaline Phosphatase: 69 U/L (ref 38–126)
Anion gap: 8 (ref 5–15)
BUN: 8 mg/dL (ref 6–20)
CO2: 26 mmol/L (ref 22–32)
Calcium: 7.9 mg/dL — ABNORMAL LOW (ref 8.9–10.3)
Chloride: 103 mmol/L (ref 98–111)
Creatinine, Ser: 0.89 mg/dL (ref 0.61–1.24)
GFR, Estimated: >60 mL/min (ref >60)
Glucose, Bld: 117 mg/dL — ABNORMAL HIGH (ref 70–99)
Potassium: 3.8 mmol/L (ref 3.5–5.1)
Sodium: 137 mmol/L (ref 135–145)
Total Bilirubin: 0.2 mg/dL — ABNORMAL LOW (ref 0.3–1.2)
Total Protein: 6.3 g/dL — ABNORMAL LOW (ref 6.5–8.1)

## 2022-01-22 LAB — CBC
HCT: 38.7 % — ABNORMAL LOW (ref 39.0–52.0)
Hemoglobin: 12.8 g/dL — ABNORMAL LOW (ref 13.0–17.0)
MCH: 27.9 pg (ref 26.0–34.0)
MCHC: 33.1 g/dL (ref 30.0–36.0)
MCV: 84.3 fL (ref 80.0–100.0)
Platelets: 282 10*3/uL (ref 150–400)
RBC: 4.59 MIL/uL (ref 4.22–5.81)
RDW: 13.3 % (ref 11.5–15.5)
WBC: 19.6 10*3/uL — ABNORMAL HIGH (ref 4.0–10.5)
nRBC: 0 % (ref 0.0–0.2)

## 2022-01-22 LAB — CBC WITH DIFFERENTIAL/PLATELET
Abs Immature Granulocytes: 0.46 10*3/uL — ABNORMAL HIGH (ref 0.00–0.07)
Basophils Absolute: 0.1 10*3/uL (ref 0.0–0.1)
Basophils Relative: 1 %
Eosinophils Absolute: 0.1 10*3/uL (ref 0.0–0.5)
Eosinophils Relative: 0 %
HCT: 37.9 % — ABNORMAL LOW (ref 39.0–52.0)
Hemoglobin: 12.8 g/dL — ABNORMAL LOW (ref 13.0–17.0)
Immature Granulocytes: 2 %
Lymphocytes Relative: 11 %
Lymphs Abs: 2.5 10*3/uL (ref 0.7–4.0)
MCH: 28.3 pg (ref 26.0–34.0)
MCHC: 33.8 g/dL (ref 30.0–36.0)
MCV: 83.7 fL (ref 80.0–100.0)
Monocytes Absolute: 1.8 10*3/uL — ABNORMAL HIGH (ref 0.1–1.0)
Monocytes Relative: 8 %
Neutro Abs: 18.3 10*3/uL — ABNORMAL HIGH (ref 1.7–7.7)
Neutrophils Relative %: 78 %
Platelets: 287 10*3/uL (ref 150–400)
RBC: 4.53 MIL/uL (ref 4.22–5.81)
RDW: 13.2 % (ref 11.5–15.5)
WBC: 23.2 10*3/uL — ABNORMAL HIGH (ref 4.0–10.5)
nRBC: 0 % (ref 0.0–0.2)

## 2022-01-22 LAB — TSH: TSH: 0.43 u[IU]/mL (ref 0.350–4.500)

## 2022-01-22 LAB — CK: Total CK: 144 U/L (ref 49–397)

## 2022-01-22 LAB — PROCALCITONIN: Procalcitonin: 1.03 ng/mL

## 2022-01-22 LAB — PHOSPHORUS: Phosphorus: 2.9 mg/dL (ref 2.5–4.6)

## 2022-01-22 LAB — STREP PNEUMONIAE URINARY ANTIGEN: Strep Pneumo Urinary Antigen: NEGATIVE

## 2022-01-22 LAB — HIV ANTIBODY (ROUTINE TESTING W REFLEX): HIV Screen 4th Generation wRfx: NONREACTIVE

## 2022-01-22 LAB — PREALBUMIN: Prealbumin: 7.7 mg/dL — ABNORMAL LOW (ref 18–38)

## 2022-01-22 LAB — MAGNESIUM: Magnesium: 1.7 mg/dL (ref 1.7–2.4)

## 2022-01-22 MED ORDER — SODIUM CHLORIDE 0.9 % IV BOLUS
500.0000 mL | Freq: Once | INTRAVENOUS | Status: AC
Start: 2022-01-22 — End: 2022-01-22
  Administered 2022-01-22: 500 mL via INTRAVENOUS

## 2022-01-22 MED ORDER — SODIUM CHLORIDE 0.9 % IV SOLN
500.0000 mg | INTRAVENOUS | Status: DC
  Administered 2022-01-22 – 2022-01-24 (×3): 500 mg via INTRAVENOUS
  Filled 2022-01-22 (×3): qty 5

## 2022-01-22 MED ORDER — ENOXAPARIN SODIUM 40 MG/0.4ML IJ SOSY
40.0000 mg | PREFILLED_SYRINGE | INTRAMUSCULAR | Status: DC
  Administered 2022-01-22 – 2022-01-23 (×2): 40 mg via SUBCUTANEOUS
  Filled 2022-01-22 (×2): qty 0.4

## 2022-01-22 MED ORDER — SODIUM CHLORIDE 0.9 % IV SOLN
1.0000 g | Freq: Once | INTRAVENOUS | Status: AC
  Administered 2022-01-22: 1 g via INTRAVENOUS
  Filled 2022-01-22: qty 10

## 2022-01-22 MED ORDER — METOCLOPRAMIDE HCL 5 MG/ML IJ SOLN
5.0000 mg | Freq: Three times a day (TID) | INTRAMUSCULAR | Status: DC
Start: 2022-01-22 — End: 2022-01-23
  Administered 2022-01-22 (×2): 5 mg via INTRAVENOUS
  Filled 2022-01-22 (×2): qty 2

## 2022-01-22 MED ORDER — HYDROCODONE-ACETAMINOPHEN 5-325 MG PO TABS
1.0000 | ORAL_TABLET | ORAL | Status: DC | PRN
  Administered 2022-01-22 (×2): 1 via ORAL
  Administered 2022-01-23 (×2): 2 via ORAL
  Filled 2022-01-22: qty 2
  Filled 2022-01-22: qty 1
  Filled 2022-01-22: qty 2
  Filled 2022-01-22: qty 1

## 2022-01-22 MED ORDER — ACETAMINOPHEN 325 MG PO TABS
650.0000 mg | ORAL_TABLET | Freq: Four times a day (QID) | ORAL | Status: DC | PRN
Start: 2022-01-22 — End: 2022-01-24

## 2022-01-22 MED ORDER — BACLOFEN 10 MG PO TABS
10.0000 mg | ORAL_TABLET | Freq: Three times a day (TID) | ORAL | Status: DC
  Administered 2022-01-22 (×2): 10 mg via ORAL
  Filled 2022-01-22 (×4): qty 1

## 2022-01-22 MED ORDER — PANTOPRAZOLE SODIUM 40 MG PO TBEC
40.0000 mg | DELAYED_RELEASE_TABLET | Freq: Every day | ORAL | Status: DC
Start: 2022-01-22 — End: 2022-01-23
  Administered 2022-01-22: 40 mg via ORAL
  Filled 2022-01-22: qty 1

## 2022-01-22 MED ORDER — TAMSULOSIN HCL 0.4 MG PO CAPS
0.4000 mg | ORAL_CAPSULE | Freq: Every day | ORAL | Status: DC
  Administered 2022-01-22 – 2022-01-23 (×2): 0.4 mg via ORAL
  Filled 2022-01-22 (×2): qty 1

## 2022-01-22 MED ORDER — ACETAMINOPHEN 650 MG RE SUPP: 650.0000 mg | Freq: Four times a day (QID) | RECTAL | Status: DC | PRN

## 2022-01-22 MED ORDER — SODIUM CHLORIDE 0.9 % IV SOLN: 75.0000 mL/h | INTRAVENOUS | Status: AC

## 2022-01-22 MED ORDER — LORAZEPAM 2 MG/ML IJ SOLN: 0.5000 mg | Freq: Four times a day (QID) | INTRAMUSCULAR | Status: DC | PRN

## 2022-01-22 MED ORDER — SODIUM CHLORIDE 0.9 % IV SOLN
2.0000 g | INTRAVENOUS | Status: DC
  Administered 2022-01-22 – 2022-01-24 (×2): 2 g via INTRAVENOUS
  Filled 2022-01-22 (×2): qty 20

## 2022-01-22 MED ORDER — ALBUTEROL SULFATE (2.5 MG/3ML) 0.083% IN NEBU: 2.5000 mg | INHALATION_SOLUTION | RESPIRATORY_TRACT | Status: DC | PRN

## 2022-01-22 MED ORDER — GUAIFENESIN ER 600 MG PO TB12
600.0000 mg | ORAL_TABLET | Freq: Two times a day (BID) | ORAL | Status: DC
  Administered 2022-01-22 – 2022-01-23 (×3): 600 mg via ORAL
  Filled 2022-01-22 (×3): qty 1

## 2022-01-22 NOTE — H&P (Signed)
? ? ?Marvin Mckee WJX:[id number] DOB: Feb 16, 1966 DOA: 01/21/2022 ?  ?  ?PCP: Charlott Rakes, MD   ?Outpatient Specialists:  NONE ?  ? ?Patient arrived to ER on 01/21/22 at 1956 ?Referred by Attending Lianne Cure, DO ? ? ?Patient coming from:   ?homeless ?  ? ?Chief Complaint:  left flank pain, cough ? ? ?HPI: ?Marvin Mckee is a 56 y.o. male with medical history significant of HTN, narcolepsy, cocaine abuse ?   ?Presented with   left flank pain  ?Coming from home with left flank pain that is radiating to his left upper quadrant.  Patient has history of nephrectomy of that side secondary to prior history of kidney cancer.  Was seen recently by PCP with possible UTI but pain has became severe. ?Has been problems with urination.  Also reports coughing up yellow mucus. ?Noted to have has been some leakage with peeing.  Some dysuria no fever but he has been somewhat lightheaded when he stands up. ?No penile discharge.  Has been out of his medication ?Patient has been using cocaine every day states that this is what he uses for his narcolepsy and it helps him stay awake ?He was prescribed Provigil in the past but have not had it for the past 2 months. ?Patient has been homeless in the past ?Also has been in reporting some dental pain recently but cannot go to the dentist his primary care has written him for some amoxicillin ?His primary care also thought that he may have some BPH and wrote him for Flomax ?  ? ? Initial COVID TEST  in house  PCR testing  Pending ?  ?  ?Regarding pertinent Chronic problems:   ?  ? HTN not on meds ?  ?   ? ?While in ER: ?  ?CT scan showed evidence of left lower lobe pneumonia patient noted to be febrile with elevated white blood cell count meeting criteria for sepsis ? ?   ? ?CXR -Left basilar pneumonia. ? ?CTabd/pelvis -no BPH no urinary or renal problems left lower lobe pneumonia noted ?  ?Following Medications were ordered in ER: ?Medications  ?ampicillin-sulbactam (UNASYN)  1.5 g in sodium chloride 0.9 % 100 mL IVPB (has no administration in time range)  ?doxycycline (VIBRAMYCIN) 100 mg in sodium chloride 0.9 % 250 mL IVPB (has no administration in time range)  ?sodium chloride 0.9 % bolus 1,000 mL (0 mLs Intravenous Stopped 01/21/22 2229)  ?ketorolac (TORADOL) 15 MG/ML injection 15 mg (15 mg Intravenous Given 01/21/22 2050)  ?morphine (PF) 4 MG/ML injection 4 mg (4 mg Intravenous Given 01/21/22 2145)  ?iohexol (OMNIPAQUE) 300 MG/ML solution 100 mL (100 mLs Intravenous Contrast Given 01/21/22 2226)  ?   ?ED Triage Vitals  ?Enc Vitals Group  ?   BP 01/21/22 1959 128/88  ?   Pulse Rate 01/21/22 1959 96  ?   Resp 01/21/22 1959 (!) 21  ?   Temp 01/21/22 1959 (!) 100.7 ?F (38.2 ?C)  ?   Temp Source 01/21/22 1959 Oral  ?   SpO2 01/21/22 1959 96 %  ?   Weight 01/21/22 2001 156 lb (70.8 kg)  ?   Height 01/21/22 2001 '6\' 1"'$  (1.854 m)  ?   Head Circumference --   ?   Peak Flow --   ?   Pain Score 01/21/22 2001 10  ?   Pain Loc --   ?   Pain Edu? --   ?   Excl. in  GC? --   ?JIRC(78)@    ? _________________________________________ ?Significant initial  Findings: ?Abnormal Labs Reviewed  ?URINALYSIS, ROUTINE W REFLEX MICROSCOPIC - Abnormal; Notable for the following components:  ?    Result Value  ? Protein, ur 100 (*)   ? All other components within normal limits  ?CBC - Abnormal; Notable for the following components:  ? WBC 24.9 (*)   ? All other components within normal limits  ?COMPREHENSIVE METABOLIC PANEL - Abnormal; Notable for the following components:  ? Sodium 134 (*)   ? Glucose, Bld 117 (*)   ? Calcium 8.5 (*)   ? Albumin 2.8 (*)   ? All other components within normal limits  ?RAPID URINE DRUG SCREEN, HOSP PERFORMED - Abnormal; Notable for the following components:  ? Cocaine POSITIVE (*)   ? All other components within normal limits  ? ?   ?ECG: Ordered ?   ?____________________ ?This patient meets SIRS Criteria and may be septic. ?   ?The recent clinical data is shown below. ?Vitals:  ?  01/21/22 2045 01/21/22 2051 01/21/22 2145 01/21/22 2200  ?BP: 127/90  125/84 122/89  ?Pulse:   86 84  ?Resp: (!) 35  (!) 22 16  ?Temp:  (!) 102.1 ?F (38.9 ?C)    ?TempSrc:  Oral    ?SpO2:   97% 97%  ?Weight:      ?Height:      ?  ?WBC ? ?   ?Component Value Date/Time  ? WBC 24.9 (H) 01/21/2022 2035  ? LYMPHSABS 2.1 01/19/2022 1507  ? MONOABS 0.6 02/21/2019 1750  ? EOSABS 0.0 01/19/2022 1507  ? BASOSABS 0.1 01/19/2022 1507  ?  ?Lactic Acid, Venous ?   ?Component Value Date/Time  ? LATICACIDVEN 1.6 01/21/2022 2035  ?  ?Procalcitonin   Ordered ?   ? UA   no evidence of UTI    ?  ?Urine analysis: ?   ?Component Value Date/Time  ? [place] YELLOW 01/21/2022 2123  ? APPEARANCEUR CLEAR 01/21/2022 2123  ? LABSPEC 1.016 01/21/2022 2123  ? PHURINE 7.0 01/21/2022 2123  ? GLUCOSEU NEGATIVE 01/21/2022 2123  ? [place] NEGATIVE 01/21/2022 2123  ? [place] NEGATIVE 01/21/2022 2123  ? BILIRUBINUR negative 01/19/2022 1615  ? [place] NEGATIVE 01/21/2022 2123  ? PROTEINUR 100 (A) 01/21/2022 2123  ? UROBILINOGEN 2.0 (A) 01/19/2022 1615  ? NITRITE NEGATIVE 01/21/2022 2123  ? LEUKOCYTESUR NEGATIVE 01/21/2022 2123  ? ? ?Results for orders placed or performed in visit on 01/19/22  ?Urine Culture     Status: None  ? Collection Time: 01/19/22  4:16 PM  ? Specimen: Urine  ? Urine  ?Result Value Ref Range Status  ? Urine Culture, Routine Final report  Final  ? Organism ID, Bacteria Comment  Final  ?  Comment: Mixed urogenital flora ?Less than 10,000 colonies/mL ?  ?  ?_______________________________________________ ?Hospitalist was called for admission for   Cocaine abuse   ?   ?Sepsis,   ?Community acquired pneumonia, unspecified laterality ?   ? ?The following Work up has been ordered so far: ? ?Orders Placed This Encounter  ?Procedures  ? Urine Culture  ? Blood culture (routine x 2)  ? CT ABDOMEN PELVIS W CONTRAST  ? DG Chest Portable 1 View  ? Urinalysis, Routine w reflex microscopic  ? CBC  ? Comprehensive metabolic panel  ? Lactic  acid, plasma  ? Rapid urine drug screen (hospital performed)  ? Maintain IV access, Saline Lock IV (if fever)  ? Consult  for Unassigned Medical Admission  ?  ? ?OTHER Significant initial  Findings: ? ?labs showing: ?  ?Recent Labs  ?Lab 01/19/22 ?1507 01/21/22 ?2035  ?NA 133* 134*  ?K 4.7 4.3  ?CO2  --  26  ?GLUCOSE 77 117*  ?BUN 10 7  ?CREATININE 1.04 1.00  ?CALCIUM 9.1 8.5*  ? ? ?Cr    stable,   ?Lab Results  ?Component Value Date  ? CREATININE 1.00 01/21/2022  ? CREATININE 1.04 01/19/2022  ? CREATININE 1.18 12/29/2020  ? ? ?Recent Labs  ?Lab 01/19/22 ?1507 01/21/22 ?2035  ?AST 26 30  ?ALT  --  43  ?ALKPHOS 96 70  ?BILITOT 0.4 0.3  ?PROT 7.3 7.5  ?ALBUMIN 4.1 2.8*  ? ?Lab Results  ?Component Value Date  ? CALCIUM 8.5 (L) 01/21/2022  ? ?     ?Plt: ?Lab Results  ?Component Value Date  ? PLT 317 01/21/2022  ? ?  ?  ?COVID-19 Labs ? ?No results for input(s): DDIMER, FERRITIN, LDH, CRP in the last 72 hours. ? ?Lab Results  ?Component Value Date  ? [place] NEGATIVE 02/22/2019  ? ?  ?   ?Recent Labs  ?Lab 01/19/22 ?1507 01/21/22 ?2035  ?WBC 19.0* 24.9*  ?NEUTROABS 15.1*  --   ?HGB 14.9 13.8  ?HCT 44.8 42.3  ?MCV 84 83.9  ?PLT 273 317  ? ? ?HG/HCT  stable,  ?   ?Component Value Date/Time  ? HGB 13.8 01/21/2022 2035  ? HGB 14.9 01/19/2022 1507  ? HCT 42.3 01/21/2022 2035  ? HCT 44.8 01/19/2022 1507  ? MCV 83.9 01/21/2022 2035  ? MCV 84 01/19/2022 1507  ?  ?    ?Cultures: ?No results found for: SDES, [place], [place], REPTSTATUS ?  ?Radiological Exams on Admission: ?CT ABDOMEN PELVIS W CONTRAST ? ?Result Date: 01/21/2022 ?CLINICAL DATA:  Abdominal pain, acute, nonlocalized. EXAM: CT ABDOMEN AND PELVIS WITH CONTRAST TECHNIQUE: Multidetector CT imaging of the abdomen and pelvis was performed using the standard protocol following bolus administration of intravenous contrast. RADIATION DOSE REDUCTION: This exam was performed according to the departmental dose-optimization program which includes automated exposure control,  adjustment of the mA and/or kV according to patient size and/or use of iterative reconstruction technique. CONTRAST:  167m OMNIPAQUE IOHEXOL 300 MG/ML  SOLN COMPARISON:  03/23/2014. FINDINGS: Lower chest: P

## 2022-01-22 NOTE — Assessment & Plan Note (Addendum)
Currently not on any blood pressure medications ?If blood pressure remains elevated we will need to be started on blood pressure medication and followed up ?

## 2022-01-22 NOTE — Assessment & Plan Note (Signed)
- -  Patient presenting with productive cough,  and infiltrate in  lower lobe on chest x-ray ?-Infiltrate on CXR and 2-3 characteristics (fever, leukocytosis, purulent sputum) are consistent with pneumonia. ?-This appears to be most likely community-acquired pneumonia.  ?  ? will admit for treatment of CAP will start on appropriate antibiotic coverage. - Rocephin/azithromycin ?  Obtain:  sputum cultures, ?                   ?                  lood cultures and sputum cultures ordered  ?                 strep pneumo UA antigen,  ?                 check for Legionella antigen. ?               Provide oxygen as needed.  ? ? ?

## 2022-01-22 NOTE — Subjective & Objective (Signed)
Coming from home with left flank pain that is radiating to his left upper quadrant.  Patient has history of nephrectomy of that side secondary to prior history of kidney cancer.  Was seen recently by PCP with possible UTI but pain has became severe. ?Has been problems with urination.  Also reports coughing up yellow mucus. ?Noted to have has been some leakage with peeing.  Some dysuria no fever but he has been somewhat lightheaded when he stands up. ?No penile discharge.  Has been out of his medication ?Patient has been using cocaine every day states that this is what he uses for his narcolepsy and it helps him stay awake ?He was prescribed Provigil in the past but have not had it for the past 2 months. ?Patient has been homeless in the past ?Also has been in reporting some dental pain recently but cannot go to the dentist his primary care has written him for some amoxicillin ?His primary care also thought that he may have some BPH and wrote him for Flomax ?

## 2022-01-22 NOTE — Assessment & Plan Note (Signed)
Patient states that he uses cocaine to treat his narcolepsy ?Encourage patient to stop as the cocaine has more side effects than a dangerous ?

## 2022-01-22 NOTE — ED Notes (Signed)
Pt. Sleeping, no hicups during sleep. ?

## 2022-01-22 NOTE — Progress Notes (Signed)
?      ?                 PROGRESS NOTE ? ?      ?PATIENT DETAILS ?Name: Marvin Mckee ?Age: 56 y.o. ?Sex: male ?Date of Birth: 02-19-1966 ?Admit Date: 01/21/2022 ?Admitting Physician Toy Baker, MD ?IRJ:JOACZY, [name], MD ? ?Brief Summary: ?Patient is a 56 y.o.  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 ? ? ?Significant events: ?4/29>> presented to ED with cough/shortness of breath-found to have PNA with sepsis.  Admit to [facility]. ? ?Significant studies: ?4/29>> CT abdomen/pelvis: Patchy airspace disease/consolidation in left lower lobe.  Complex hypodensity in the lower pole of the right kidney measuring 1.1 cm. ? ?Significant microbiology data: ?4/30>> COVID/influenza PCR: Negative ?4/30>> blood culture: No growth ? ?Procedures: ?None ? ?Consults: ?None  ? ?Subjective: ?Comfortable-complains of some issues with urination-mostly dribbling after he urinates.  Main issue this morning is hiccups. ? ?Objective: ?Vitals: ?Blood pressure 101/88, pulse 84, temperature (!) 102.1 ?F (38.9 ?C), temperature source Oral, resp. rate 18, height '6\' 1"'$  (1.854 m), weight 70.8 kg, SpO2 97 %.  ? ?Exam: ?Gen Exam:Alert awake-not in any distress ?HEENT:atraumatic, normocephalic ?Chest: B/L clear to auscultation anteriorly ?CVS:S1S2 regular ?Abdomen:soft non tender, non distended ?Extremities:no edema ?Neurology: Non focal ?Skin: no rash ? ?Pertinent Labs/Radiology: ? ?  Latest Ref Rng & Units 01/22/2022  ?  7:09 AM 01/22/2022  ?  4:36 AM 01/21/2022  ?  8:35 PM  ?CBC  ?WBC 4.0 - 10.5 K/uL 19.6   23.2   24.9    ?Hemoglobin 13.0 - 17.0 g/dL 12.8   12.8   13.8    ?Hematocrit 39.0 - 52.0 % 38.7   37.9   42.3    ?Platelets 150 - 400 K/uL 282   287   317    ?  ?Lab Results  ?Component Value Date  ? NA 137 01/22/2022  ? K 3.8 01/22/2022  ? CL 103 01/22/2022  ? CO2 26  01/22/2022  ?  ? ? ?Assessment/Plan: ?Sepsis due to left-sided pneumonia: Sepsis physiology is improved-continue Rocephin/Zithromax-await further culture data. ? ?Hiccups: Likely triggered by PNA-we will do a trial of PPI/baclofen and Reglan and see how he does. ? ?History of renal cell carcinoma: Claims he is s/p nephrectomy-unclear which side this was on.  CT abdomen shows a complex hypodensity in the lower pole of the left kidney.  He will need further work-up in the outpatient setting by his PCP. ? ?History of narcolepsy: Noncompliant with Provigil by choice-claims he is using cocaine to stay awake.  He has previously followed with neurology and with pulmonology. ? ?History of cocaine use: Counseled-watch for withdrawal symptom-on Ativan for as needed anxiety issues. ? ?BPH: Acknowledges he was on Flomax but apparently his PCP stopped it several months ago.  He complains of frequent urination-difficulty making a stream-and sometimes incontinence after he has urinated-restart Flomax and see how he does. ? ?BMI: ?Estimated body mass index is 20.58 kg/m? as calculated from the following: ?  Height as of this encounter: '6\' 1"'$  (1.854 m). ?  Weight as of this encounter: 70.8 kg.  ? ?Code status: ?  Code Status: Full Code  ? ?DVT Prophylaxis: ?enoxaparin (LOVENOX) injection 40 mg Start: 01/22/22 1000 ?SCDs Start: 01/22/22 0709 ?  ?Family Communication: None at bedside ? ?Disposition Plan: ?Status  is: Inpatient ?Remains inpatient appropriate because: Sepsis secondary to PNA-on IV antibiotics-not yet stable for discharge. ?  ?Planned Discharge Destination:Home ? ? ?Diet: ?Diet Order   ? ?       ?  Diet Heart Room service appropriate? Yes; Fluid consistency: Thin  Diet effective now       ?  ? ?  ?  ? ?  ?  ? ? ?Antimicrobial agents: ?Anti-infectives (From admission, onward)  ? ? Start     Dose/Rate Route Frequency Ordered Stop  ? 01/23/22 0000  cefTRIAXone (ROCEPHIN) 2 g in sodium chloride 0.9 % 100 mL IVPB       ? 2  g ?200 mL/hr over 30 Minutes Intravenous Every 24 hours 01/22/22 0045 01/27/22 2359  ? 01/22/22 0130  cefTRIAXone (ROCEPHIN) 1 g in sodium chloride 0.9 % 100 mL IVPB       ? 1 g ?200 mL/hr over 30 Minutes Intravenous  Once 01/22/22 0053 01/22/22 0707  ? 01/22/22 0045  cefTRIAXone (ROCEPHIN) 1 g in sodium chloride 0.9 % 100 mL IVPB       ? 1 g ?200 mL/hr over 30 Minutes Intravenous  Once 01/22/22 0037 01/22/22 0120  ? 01/22/22 0045  azithromycin (ZITHROMAX) 500 mg in sodium chloride 0.9 % 250 mL IVPB       ? 500 mg ?250 mL/hr over 60 Minutes Intravenous Every 24 hours 01/22/22 0037    ? 01/22/22 0000  ampicillin-sulbactam (UNASYN) 1.5 g in sodium chloride 0.9 % 100 mL IVPB  Status:  Discontinued       ? 1.5 g ?200 mL/hr over 30 Minutes Intravenous  Once 01/21/22 2357 01/22/22 0036  ? 01/22/22 0000  doxycycline (VIBRAMYCIN) 100 mg in sodium chloride 0.9 % 250 mL IVPB  Status:  Discontinued       ? 100 mg ?125 mL/hr over 120 Minutes Intravenous Every 12 hours 01/21/22 2357 01/22/22 0036  ? ?  ? ? ? ?MEDICATIONS: ?Scheduled Meds: ? baclofen  10 mg Oral TID  ? enoxaparin (LOVENOX) injection  40 mg Subcutaneous Q24H  ? guaiFENesin  600 mg Oral BID  ? metoCLOPramide (REGLAN) injection  5 mg Intravenous Q8H  ? pantoprazole  40 mg Oral Q1200  ? tamsulosin  0.4 mg Oral Daily  ? ?Continuous Infusions: ? sodium chloride    ? azithromycin Stopped (01/22/22 1749)  ? [START ON 01/23/2022] cefTRIAXone (ROCEPHIN)  IV    ? ?PRN Meds:.acetaminophen **OR** acetaminophen, albuterol, HYDROcodone-acetaminophen, LORazepam ? ? ?I have personally reviewed following labs and imaging studies ? ?LABORATORY DATA: ?CBC: ?Recent Labs  ?Lab 01/19/22 ?1507 01/21/22 ?2035 01/22/22 ?0436 01/22/22 ?4496  ?WBC 19.0* 24.9* 23.2* 19.6*  ?NEUTROABS 15.1*  --  18.3*  --   ?HGB 14.9 13.8 12.8* 12.8*  ?HCT 44.8 42.3 37.9* 38.7*  ?MCV 84 83.9 83.7 84.3  ?PLT 273 317 287 282  ? ? ?Basic Metabolic Panel: ?Recent Labs  ?Lab 01/19/22 ?1507 01/21/22 ?2035  01/22/22 ?0436 01/22/22 ?7591  ?NA 133* 134*  --  137  ?K 4.7 4.3  --  3.8  ?CL 93* 99  --  103  ?CO2  --  26  --  26  ?GLUCOSE 77 117*  --  117*  ?BUN 10 7  --  8  ?CREATININE 1.04 1.00  --  0.89  ?CALCIUM 9.1 8.5*  --  7.9*  ?MG  --   --  1.7  --   ?PHOS  --   --  2.9  --   ? ? ?  GFR: ?Estimated Creatinine Clearance: 96.1 mL/min (by C-G formula based on SCr of 0.89 mg/dL). ? ?Liver Function Tests: ?Recent Labs  ?Lab 01/19/22 ?1507 01/21/22 ?2035 01/22/22 ?0436 01/22/22 ?4680  ?AST '26 30 23 25  '$ ?ALT  --  43 30 31  ?ALKPHOS 96 70 67 69  ?BILITOT 0.4 0.3 0.3 0.2*  ?PROT 7.3 7.5 6.4* 6.3*  ?ALBUMIN 4.1 2.8* 2.3* 2.3*  ? ?No results for input(s): LIPASE, AMYLASE in the last 168 hours. ?No results for input(s): AMMONIA in the last 168 hours. ? ?Coagulation Profile: ?No results for input(s): INR, PROTIME in the last 168 hours. ? ?Cardiac Enzymes: ?Recent Labs  ?Lab 01/22/22 ?0436  ?CKTOTAL 144  ? ? ?BNP (last 3 results) ?No results for input(s): PROBNP in the last 8760 hours. ? ?Lipid Profile: ?Recent Labs  ?  01/19/22 ?1507  ?CHOL 175  ?HDL 66  ?[place] 97  ?TRIG 62  ?CHOLHDL 2.7  ? ? ?Thyroid Function Tests: ?Recent Labs  ?  01/22/22 ?0436  ?TSH 0.430  ? ? ?Anemia Panel: ?No results for input(s): VITAMINB12, FOLATE, FERRITIN, TIBC, IRON, RETICCTPCT in the last 72 hours. ? ?Urine analysis: ?   ?Component Value Date/Time  ? [place] YELLOW 01/21/2022 2123  ? APPEARANCEUR CLEAR 01/21/2022 2123  ? LABSPEC 1.016 01/21/2022 2123  ? PHURINE 7.0 01/21/2022 2123  ? GLUCOSEU NEGATIVE 01/21/2022 2123  ? [place] NEGATIVE 01/21/2022 2123  ? [place] NEGATIVE 01/21/2022 2123  ? BILIRUBINUR negative 01/19/2022 1615  ? [place] NEGATIVE 01/21/2022 2123  ? PROTEINUR 100 (A) 01/21/2022 2123  ? UROBILINOGEN 2.0 (A) 01/19/2022 1615  ? NITRITE NEGATIVE 01/21/2022 2123  ? LEUKOCYTESUR NEGATIVE 01/21/2022 2123  ? ? ?Sepsis Labs: ?Lactic Acid, Venous ?   ?Component Value Date/Time  ? LATICACIDVEN 1.2 01/22/2022 0441   ? ? ?MICROBIOLOGY: ?Recent Results (from the past 240 hour(s))  ?Urine Culture     Status: None  ? Collection Time: 01/19/22  4:16 PM  ? Specimen: Urine  ? Urine  ?Result Value Ref Range Status  ? Urine Culture, Routine Final report  Fin

## 2022-01-22 NOTE — Plan of Care (Signed)

## 2022-01-22 NOTE — ED Notes (Signed)
Breakfast Orders Placed °

## 2022-01-22 NOTE — Assessment & Plan Note (Signed)
-  SIRS criteria met with  elevated white blood cell count,    ?   ?Component Value Date/Time  ? WBC 24.9 (H) 01/21/2022 2035  ? LYMPHSABS 2.1 01/19/2022 1507  ? ? ? fever   RR >20 ?Today's Vitals  ? 01/21/22 2045 01/21/22 2051 01/21/22 2145 01/21/22 2200  ?BP: 127/90  125/84 122/89  ?Pulse:   86 84  ?Resp: (!) 35  (!) 22 16  ?Temp:  (!) 102.1 ?F (38.9 ?C)    ?TempSrc:  Oral    ?SpO2:   97% 97%  ?Weight:      ?Height:      ?PainSc:      ? The recent clinical data is shown below. ?Vitals:  ? 01/21/22 2045 01/21/22 2051 01/21/22 2145 01/21/22 2200  ?BP: 127/90  125/84 122/89  ?Pulse:   86 84  ?Resp: (!) 35  (!) 22 16  ?Temp:  (!) 102.1 ?F (38.9 ?C)    ?TempSrc:  Oral    ?SpO2:   97% 97%  ?Weight:      ?Height:      ? ?  ?-Most likely source being:  Pulmonary  ?  ? ?  ? - Obtain serial lactic acid and procalcitonin level. ? - Initiated IV antibiotics in ER: ?Antibiotics Given (last 72 hours)   ? Date/Time Action Medication Dose Rate  ? 01/22/22 0044 New Bag/Given  ? cefTRIAXone (ROCEPHIN) 1 g in sodium chloride 0.9 % 100 mL IVPB 1 g 200 mL/hr  ?  ? ? ?Will continue  on : Rocephin and azithromycin ? ? - await results of blood and urine culture ? - Rehydrate aggressively  ?Intravenous fluids were administered,  ?   ? ?1:00 AM ? ?

## 2022-01-23 LAB — COMPREHENSIVE METABOLIC PANEL
ALT: 41 U/L (ref 0–44)
AST: 38 U/L (ref 15–41)
Albumin: 2.3 g/dL — ABNORMAL LOW (ref 3.5–5.0)
Alkaline Phosphatase: 65 U/L (ref 38–126)
Anion gap: 7 (ref 5–15)
BUN: 15 mg/dL (ref 6–20)
CO2: 26 mmol/L (ref 22–32)
Calcium: 8.1 mg/dL — ABNORMAL LOW (ref 8.9–10.3)
Chloride: 104 mmol/L (ref 98–111)
Creatinine, Ser: 0.93 mg/dL (ref 0.61–1.24)
GFR, Estimated: >60 mL/min (ref >60)
Glucose, Bld: 95 mg/dL (ref 70–99)
Potassium: 4.2 mmol/L (ref 3.5–5.1)
Sodium: 137 mmol/L (ref 135–145)
Total Bilirubin: 0.3 mg/dL (ref 0.3–1.2)
Total Protein: 6.4 g/dL — ABNORMAL LOW (ref 6.5–8.1)

## 2022-01-23 LAB — CBC
HCT: 38.3 % — ABNORMAL LOW (ref 39.0–52.0)
Hemoglobin: 12.8 g/dL — ABNORMAL LOW (ref 13.0–17.0)
MCH: 28.1 pg (ref 26.0–34.0)
MCHC: 33.4 g/dL (ref 30.0–36.0)
MCV: 84.2 fL (ref 80.0–100.0)
Platelets: 296 10*3/uL (ref 150–400)
RBC: 4.55 MIL/uL (ref 4.22–5.81)
RDW: 13.4 % (ref 11.5–15.5)
WBC: 14 10*3/uL — ABNORMAL HIGH (ref 4.0–10.5)
nRBC: 0 % (ref 0.0–0.2)

## 2022-01-23 LAB — URINE CULTURE: Culture: <10000 — AB

## 2022-01-23 MED ORDER — METOCLOPRAMIDE HCL 5 MG/ML IJ SOLN
10.0000 mg | Freq: Three times a day (TID) | INTRAMUSCULAR | Status: AC
  Administered 2022-01-23 (×3): 10 mg via INTRAVENOUS
  Filled 2022-01-23 (×3): qty 2

## 2022-01-23 MED ORDER — BACLOFEN 10 MG PO TABS
10.0000 mg | ORAL_TABLET | Freq: Three times a day (TID) | ORAL | Status: DC
  Administered 2022-01-23 (×3): 10 mg via ORAL
  Filled 2022-01-23 (×3): qty 1

## 2022-01-23 MED ORDER — ALUM & MAG HYDROXIDE-SIMETH 200-200-20 MG/5ML PO SUSP
30.0000 mL | ORAL | Status: DC | PRN
  Administered 2022-01-23: 30 mL via ORAL
  Filled 2022-01-23: qty 30

## 2022-01-23 MED ORDER — HYDROCOD POLI-CHLORPHE POLI ER 10-8 MG/5ML PO SUER
5.0000 mL | Freq: Two times a day (BID) | ORAL | Status: DC | PRN
  Administered 2022-01-23: 5 mL via ORAL
  Filled 2022-01-23: qty 5

## 2022-01-23 MED ORDER — PANTOPRAZOLE SODIUM 40 MG PO TBEC
40.0000 mg | DELAYED_RELEASE_TABLET | Freq: Two times a day (BID) | ORAL | Status: DC
  Administered 2022-01-23 (×2): 40 mg via ORAL
  Filled 2022-01-23 (×2): qty 1

## 2022-01-23 NOTE — Progress Notes (Signed)
?      ?                 PROGRESS NOTE ? ?      ?PATIENT DETAILS ?Name: Marvin Mckee ?Age: 56 y.o. ?Sex: male ?Date of Birth: [birth year] ?Admit Date: 01/21/2022 ?Admitting Physician Toy Baker, MD ?PYK:DXIPJA, [name], MD ? ?Brief Summary: ?Patient is a 56 y.o.  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 ? ? ?Significant events: ?4/29>> presented to ED with cough/shortness of breath-found to have PNA with sepsis.  Admit to [facility]. ? ?Significant studies: ?4/29>> CT abdomen/pelvis: Patchy airspace disease/consolidation in left lower lobe.  Complex hypodensity in the lower pole of the right kidney measuring 1.1 cm. ? ?Significant microbiology data: ?4/30>> COVID/influenza PCR: Negative ?4/30>> blood culture: No growth ? ?Procedures: ?None ? ?Consults: ?None  ? ?Subjective: ?Hiccups have mildly improved-no longer when he is sleeping-only when he coughs and starts talking.  Complains of persistent cough at times.  Otherwise appears comfortable. ? ?Objective: ?Vitals: ?Blood pressure 128/84, pulse 76, temperature 99 ?F (37.2 ?C), temperature source Oral, resp. rate 15, height '6\' 1"'$  (1.854 m), weight 70.8 kg, SpO2 98 %.  ? ?Exam: ?Gen Exam:Alert awake-not in any distress ?HEENT:atraumatic, normocephalic ?Chest: B/L clear to auscultation anteriorly ?CVS:S1S2 regular ?Abdomen:soft non tender, non distended ?Extremities:no edema ?Neurology: Non focal ?Skin: no rash  ? ?Pertinent Labs/Radiology: ? ?  Latest Ref Rng & Units 01/23/2022  ? 12:42 AM 01/22/2022  ?  7:09 AM 01/22/2022  ?  4:36 AM  ?CBC  ?WBC 4.0 - 10.5 K/uL 14.0   19.6   23.2    ?Hemoglobin 13.0 - 17.0 g/dL 12.8   12.8   12.8    ?Hematocrit 39.0 - 52.0 % 38.3   38.7   37.9    ?Platelets 150 - 400 K/uL 296   282   287    ?  ?Lab Results  ?Component Value Date  ? NA 137 01/23/2022  ? K 4.2  01/23/2022  ? CL 104 01/23/2022  ? CO2 26 01/23/2022  ? ?  ? ? ?Assessment/Plan: ?Sepsis due to left-sided pneumonia: Sepsis physiology has resolved-leukocytosis downtrending-continue Rocephin/Zithromax.  Cultures negative so far.  ? ?Hiccups: Likely triggered by PNA-somewhat improved but still persists-continue trial of PPI/baclofen and Reglan.  Since he claims that it is triggered by coughing spells-we will try some antitussives as well. ? ?History of renal cell carcinoma: Claims he is s/p nephrectomy-unclear which side this was on.  CT abdomen shows a complex hypodensity in the lower pole of the left kidney.  He will need further work-up in the outpatient setting by his PCP. ? ?History of narcolepsy: Noncompliant with Provigil by choice-claims he is using cocaine to stay awake.  He has previously followed with neurology and with pulmonology. ? ?History of cocaine use: Counseled-watch for withdrawal symptom-on Ativan for as needed anxiety issues. ? ?BPH: Acknowledges he was on Flomax but apparently his PCP stopped it several months ago.  He complains of frequent urination-difficulty making a stream-and sometimes incontinence after he has urinated-the symptoms have somewhat improved after resuming Flomax.   ? ?BMI: ?Estimated body mass index is 20.58 kg/m? as calculated from the following: ?  Height as of this encounter: '6\' 1"'$  (1.854 m). ?  Weight as of this encounter: 70.8 kg.  ? ?Code status: ?  Code Status:  Full Code  ? ?DVT Prophylaxis: ?enoxaparin (LOVENOX) injection 40 mg Start: 01/22/22 1000 ?SCDs Start: 01/22/22 0709 ?  ?Family Communication: None at bedside ? ?Disposition Plan: ?Status is: Inpatient ?Remains inpatient appropriate because: Sepsis secondary to PNA-on IV antibiotics-not yet stable for discharge. ?  ?Planned Discharge Destination:Home ? ? ?Diet: ?Diet Order   ? ?       ?  Diet Heart Room service appropriate? Yes; Fluid consistency: Thin  Diet effective now       ?  ? ?  ?  ? ?  ?   ? ? ?Antimicrobial agents: ?Anti-infectives (From admission, onward)  ? ? Start     Dose/Rate Route Frequency Ordered Stop  ? 01/23/22 0000  cefTRIAXone (ROCEPHIN) 2 g in sodium chloride 0.9 % 100 mL IVPB       ? 2 g ?200 mL/hr over 30 Minutes Intravenous Every 24 hours 01/22/22 0045 01/27/22 2359  ? 01/22/22 0130  cefTRIAXone (ROCEPHIN) 1 g in sodium chloride 0.9 % 100 mL IVPB       ? 1 g ?200 mL/hr over 30 Minutes Intravenous  Once 01/22/22 0053 01/22/22 0707  ? 01/22/22 0045  cefTRIAXone (ROCEPHIN) 1 g in sodium chloride 0.9 % 100 mL IVPB       ? 1 g ?200 mL/hr over 30 Minutes Intravenous  Once 01/22/22 0037 01/22/22 0120  ? 01/22/22 0045  azithromycin (ZITHROMAX) 500 mg in sodium chloride 0.9 % 250 mL IVPB       ? 500 mg ?250 mL/hr over 60 Minutes Intravenous Every 24 hours 01/22/22 0037    ? 01/22/22 0000  ampicillin-sulbactam (UNASYN) 1.5 g in sodium chloride 0.9 % 100 mL IVPB  Status:  Discontinued       ? 1.5 g ?200 mL/hr over 30 Minutes Intravenous  Once 01/21/22 2357 01/22/22 0036  ? 01/22/22 0000  doxycycline (VIBRAMYCIN) 100 mg in sodium chloride 0.9 % 250 mL IVPB  Status:  Discontinued       ? 100 mg ?125 mL/hr over 120 Minutes Intravenous Every 12 hours 01/21/22 2357 01/22/22 0036  ? ?  ? ? ? ?MEDICATIONS: ?Scheduled Meds: ? baclofen  10 mg Oral TID  ? enoxaparin (LOVENOX) injection  40 mg Subcutaneous Q24H  ? guaiFENesin  600 mg Oral BID  ? metoCLOPramide (REGLAN) injection  10 mg Intravenous Q8H  ? pantoprazole  40 mg Oral BID  ? tamsulosin  0.4 mg Oral Daily  ? ?Continuous Infusions: ? azithromycin 500 mg (01/22/22 2214)  ? cefTRIAXone (ROCEPHIN)  IV 2 g (01/22/22 2135)  ? ?PRN Meds:.acetaminophen **OR** acetaminophen, albuterol, alum & mag hydroxide-simeth, chlorpheniramine-HYDROcodone, HYDROcodone-acetaminophen, LORazepam ? ? ?I have personally reviewed following labs and imaging studies ? ?LABORATORY DATA: ?CBC: ?Recent Labs  ?Lab 01/19/22 ?1507 01/21/22 ?2035 01/22/22 ?0436 01/22/22 ?0709  01/23/22 ?1448  ?WBC 19.0* 24.9* 23.2* 19.6* 14.0*  ?NEUTROABS 15.1*  --  18.3*  --   --   ?HGB 14.9 13.8 12.8* 12.8* 12.8*  ?HCT 44.8 42.3 37.9* 38.7* 38.3*  ?MCV 84 83.9 83.7 84.3 84.2  ?PLT 273 317 287 282 296  ? ? ? ?Basic Metabolic Panel: ?Recent Labs  ?Lab 01/19/22 ?1507 01/21/22 ?2035 01/22/22 ?0436 01/22/22 ?0709 01/23/22 ?1856  ?NA 133* 134*  --  137 137  ?K 4.7 4.3  --  3.8 4.2  ?CL 93* 99  --  103 104  ?CO2  --  26  --  26 26  ?GLUCOSE 77 117*  --  117* 95  ?  BUN 10 7  --  8 15  ?CREATININE 1.04 1.00  --  0.89 0.93  ?CALCIUM 9.1 8.5*  --  7.9* 8.1*  ?MG  --   --  1.7  --   --   ?PHOS  --   --  2.9  --   --   ? ? ? ?GFR: ?Estimated Creatinine Clearance: 92 mL/min (by C-G formula based on SCr of 0.93 mg/dL). ? ?Liver Function Tests: ?Recent Labs  ?Lab 01/19/22 ?1507 01/21/22 ?2035 01/22/22 ?0436 01/22/22 ?0709 01/23/22 ?2595  ?AST '26 30 23 25 '$ 38  ?ALT  --  43 30 31 41  ?ALKPHOS 96 70 67 69 65  ?BILITOT 0.4 0.3 0.3 0.2* 0.3  ?PROT 7.3 7.5 6.4* 6.3* 6.4*  ?ALBUMIN 4.1 2.8* 2.3* 2.3* 2.3*  ? ? ?No results for input(s): LIPASE, AMYLASE in the last 168 hours. ?No results for input(s): AMMONIA in the last 168 hours. ? ?Coagulation Profile: ?No results for input(s): INR, PROTIME in the last 168 hours. ? ?Cardiac Enzymes: ?Recent Labs  ?Lab 01/22/22 ?0436  ?CKTOTAL 144  ? ? ? ?BNP (last 3 results) ?No results for input(s): PROBNP in the last 8760 hours. ? ?Lipid Profile: ?No results for input(s): CHOL, HDL, LDLCALC, TRIG, CHOLHDL, LDLDIRECT in the last 72 hours. ? ? ?Thyroid Function Tests: ?Recent Labs  ?  01/22/22 ?0436  ?TSH 0.430  ? ? ? ?Anemia Panel: ?No results for input(s): VITAMINB12, FOLATE, FERRITIN, TIBC, IRON, RETICCTPCT in the last 72 hours. ? ?Urine analysis: ?   ?Component Value Date/Time  ? [place] YELLOW 01/21/2022 2123  ? APPEARANCEUR CLEAR 01/21/2022 2123  ? LABSPEC 1.016 01/21/2022 2123  ? PHURINE 7.0 01/21/2022 2123  ? GLUCOSEU NEGATIVE 01/21/2022 2123  ? [place] NEGATIVE 01/21/2022 2123  ?  [place] NEGATIVE 01/21/2022 2123  ? BILIRUBINUR negative 01/19/2022 1615  ? [place] NEGATIVE 01/21/2022 2123  ? PROTEINUR 100 (A) 01/21/2022 2123  ? UROBILINOGEN 2.0 (A) 01/19/2022 1615  ? NITRITE NEGATIVE 01/21/2022 2

## 2022-01-24 ENCOUNTER — Other Ambulatory Visit (HOSPITAL_COMMUNITY): Payer: Self-pay

## 2022-01-24 DIAGNOSIS — N39498 Other specified urinary incontinence: Secondary | ICD-10-CM

## 2022-01-24 LAB — LEGIONELLA PNEUMOPHILA SEROGP 1 UR AG: L. pneumophila Serogp 1 Ur Ag: NEGATIVE

## 2022-01-24 LAB — CBC
HCT: 36.8 % — ABNORMAL LOW (ref 39.0–52.0)
Hemoglobin: 12.2 g/dL — ABNORMAL LOW (ref 13.0–17.0)
MCH: 27.6 pg (ref 26.0–34.0)
MCHC: 33.2 g/dL (ref 30.0–36.0)
MCV: 83.3 fL (ref 80.0–100.0)
Platelets: 314 10*3/uL (ref 150–400)
RBC: 4.42 MIL/uL (ref 4.22–5.81)
RDW: 13.5 % (ref 11.5–15.5)
WBC: 12.4 10*3/uL — ABNORMAL HIGH (ref 4.0–10.5)
nRBC: 0 % (ref 0.0–0.2)

## 2022-01-24 MED ORDER — CEFDINIR 300 MG PO CAPS
300.0000 mg | ORAL_CAPSULE | Freq: Two times a day (BID) | ORAL | 0 refills | Status: DC
  Filled 2022-01-24: qty 8, 4d supply, fill #0

## 2022-01-24 MED ORDER — PANTOPRAZOLE SODIUM 40 MG PO TBEC
40.0000 mg | DELAYED_RELEASE_TABLET | Freq: Every day | ORAL | 0 refills | Status: AC
Start: 2022-01-24 — End: ?
  Filled 2022-01-24: qty 30, 30d supply, fill #0

## 2022-01-24 MED ORDER — BENZONATATE 200 MG PO CAPS
200.0000 mg | ORAL_CAPSULE | Freq: Two times a day (BID) | ORAL | 0 refills | Status: DC | PRN
  Filled 2022-01-24: qty 20, 10d supply, fill #0

## 2022-01-24 MED ORDER — TAMSULOSIN HCL 0.4 MG PO CAPS
0.4000 mg | ORAL_CAPSULE | Freq: Every day | ORAL | 3 refills | Status: DC
  Filled 2022-01-24: qty 30, 30d supply, fill #0

## 2022-01-24 MED ORDER — BACLOFEN 10 MG PO TABS
10.0000 mg | ORAL_TABLET | Freq: Three times a day (TID) | ORAL | 0 refills | Status: AC
  Filled 2022-01-24: qty 9, 3d supply, fill #0

## 2022-01-24 NOTE — Progress Notes (Signed)
Pt alert and oriented x4. Verbalized understanding of dc instructions,  follow-up and medication to be delivered by TOC.  Called TOC medication will be delivered to DC lounge then pt will call his ride for transport home.   ?

## 2022-01-24 NOTE — Discharge Summary (Signed)
? ?PATIENT DETAILS ?Name: Marvin Mckee ?Age: 56 y.o. ?Sex: male ?Date of Birth: August 12, 1966 ?MRN: [id number]. ?Admitting Physician: Toy Baker, MD ?WNU:[id number], [name], MD ? ?Admit Date: 01/21/2022 ?Discharge date: 01/24/2022 ? ?Recommendations for Outpatient Follow-up:  ?Follow up with PCP in 1-2 weeks ?Please obtain CMP/CBC in one week ?Incidental finding-complex density in the lower pole of left kidney-will require further work-up to be done by PCP in the outpatient setting.  Patient reportedly has a history of renal cell carcinoma ? ?Admitted From:  ?Home ? ?Disposition: ?Home ?  ?Discharge Condition: ?good ? ?CODE STATUS: ?  Code Status: Full Code  ? ?Diet recommendation:  ?Diet Order   ? ?       ?  Diet general       ?  ?  Diet Heart Room service appropriate? Yes; Fluid consistency: Thin  Diet effective now       ?  ? ?  ?  ? ?  ?  ? ?Brief Summary: ?Patient is a 56 y.o.  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 ?   ?Significant events: ?4/29>> presented to ED with cough/shortness of breath-found to have PNA with sepsis.  Admit to [facility]. ?  ?Significant studies: ?4/29>> CT abdomen/pelvis: Patchy airspace disease/consolidation in left lower lobe.  Complex hypodensity in the lower pole of the right kidney measuring 1.1 cm. ?  ?Significant microbiology data: ?4/30>> COVID/influenza PCR: Negative ?4/30>> blood culture: No growth ?  ?Procedures: ?None ?  ?Consults: ?None  ? ?Brief Hospital Course: ?Sepsis due to left-sided pneumonia: Sepsis physiology has resolved-leukocytosis has almost normalized-treated with Rocephin/Zithromax-we will transition to oral antimicrobial regimen for a few more days on discharge.  ?  ?Hiccups: Likely triggered by PNA-much better this morning-while I was in the room-hardly had any hiccups.  Patient had  intractable hiccups when he first presented.  Patient was treated with a trial of baclofen/Reglan and scheduled PPI.  Hopefully as his pneumonia gets further treated-hiccups will continue to improve.  Continue PPI and a few more days of baclofen on discharge.  Per bedside RN-no major issues overnight-eating without any issues-and moving around the room without any major issues. ?  ?History of renal cell carcinoma: Claims he is s/p left-sided partial nephrectomy-this was apparently done in 2019 at [facility] when patient was in prison.  CT abdomen shows a complex hypodensity in the lower pole of the left kidney.  Discussed with patient regarding this finding-he apparently was told that the malignancy could recur-spoke with urologist-Dr. Bernarda Caffey day of discharge-his office will try and get the patient a follow-up appointment for further surveillance/work-up to be done in the outpatient setting.  ? ?History of narcolepsy: Noncompliant with Provigil by choice-claims he is using cocaine to stay awake.  He has previously followed with neurology and with pulmonology.  I have asked him to follow-up with his PCP/neurology/P pulmonology to see if Provigil can be resumed. ?  ?History of cocaine use: Counseled-somewhat anxious but no major withdrawal issues. ?  ?BPH: Acknowledges he was on Flomax but apparently his PCP stopped it several months ago.  He complains of frequent urination-difficulty making a stream-and sometimes incontinence after he has urinated-the symptoms have somewhat improved after resuming Flomax.  We will plan on continuing Flomax on discharge. ?  ?BMI: ?Estimated body mass index is 20.58 kg/m? as calculated from the following: ?  Height as of this encounter: '6\' 1"'$  (1.854 m). ?  Weight as of this encounter: 70.8 kg.  ? ? ?Obesity: ?Estimated body mass index is 20.58 kg/m? as calculated from the following: ?  Height as of this encounter: '6\' 1"'$  (1.854 m). ?  Weight as of this encounter: 70.8 kg.   ? ?Discharge Diagnoses:  ?Principal Problem: ?  CAP (community acquired pneumonia) ?Active Problems: ?  HTN (hypertension) ?  Cocaine abuse ([place]) ?  Sepsis ([place]) ? ? ?Discharge Instructions: ? ?Activity:  ?As tolerated  ? ?Discharge Instructions   ? ? Call MD for:  difficulty breathing, headache or visual disturbances   Complete by: As directed ?  ? Call MD for:  persistant nausea and vomiting   Complete by: As directed ?  ? Call MD for:  temperature >100.4   Complete by: As directed ?  ? Diet general   Complete by: As directed ?  ? Discharge instructions   Complete by: As directed ?  ? Follow with Primary MD  Charlott Rakes, MD in 1-2 weeks ? ?You have a complex hypodensity in the lower pole of the left kidney-we will get a call from [facility]-Dr. Laverda Page a follow-up in the outpatient setting.   If you do not hear from them-please give them a call. ? ?Please talk to your primary pulmonologist/neurologist regarding resumption of Provigil. ? ?Please stop further cocaine use. ? ?Please get a complete blood count and chemistry panel checked by your Primary MD at your next visit, and again as instructed by your Primary MD. ? ?Get Medicines reviewed and adjusted: ?Please take all your medications with you for your next visit with your Primary MD ? ?Laboratory/radiological data: ?Please request your Primary MD to go over all hospital tests and procedure/radiological results at the follow up, please ask your Primary MD to get all Hospital records sent to his/her office. ? ?In some cases, they will be blood work, cultures and biopsy results pending at the time of your discharge. Please request that your primary care M.D. follows up on these results. ? ?Also Note the following: ?If you experience worsening of your admission symptoms, develop shortness of breath, life threatening emergency, suicidal or homicidal thoughts you must seek medical attention immediately by calling 911 or calling your MD immediately  if  symptoms less severe. ? ?You must read complete instructions/literature along with all the possible adverse reactions/side effects for all the Medicines you take and that have been prescribed to you. Take any new Medicines after you have completely understood and accpet all the possible adverse reactions/side effects.  ? ?Do not drive when taking Pain medications or sleeping medications (Benzodaizepines) ? ?Do not take more than prescribed Pain, Sleep and Anxiety Medications. It is not advisable to combine anxiety,sleep and pain medications without talking with your primary care practitioner ? ?Special Instructions: If you have smoked or chewed Tobacco  in the last 2 yrs please stop smoking, stop any regular Alcohol  and or any Recreational drug use. ? ?Wear Seat belts while driving. ? ?Please note: ?You were cared for by a hospitalist during your hospital stay. Once you are discharged, your primary care physician will handle any further medical issues. Please note that NO REFILLS for any discharge medications will be authorized once you are discharged, as it is imperative that you return to your primary care physician (or establish a relationship with a primary care physician if you do not have one) for your post hospital discharge needs  so that they can reassess your need for medications and monitor your lab values.  ? Increase activity slowly   Complete by: As directed ?  ? ?  ? ?Allergies as of 01/24/2022   ?No Known Allergies ?  ? ?  ?Medication List  ?  ? ?STOP taking these medications   ? ?acetaminophen 500 MG tablet ?Commonly known as: TYLENOL ?  ?DAYQUIL PO ?  ?NYQUIL PO ?  ? ?  ? ?TAKE these medications   ? ?baclofen 10 MG tablet ?Commonly known as: LIORESAL ?Take 1 tablet (10 mg total) by mouth 3 (three) times daily for 3 days. ?  ?benzonatate 200 MG capsule ?Commonly known as: TESSALON ?Take 1 capsule (200 mg total) by mouth 2 (two) times daily as needed for cough. ?  ?cefdinir 300 MG capsule ?Commonly  known as: OMNICEF ?Take 1 capsule (300 mg total) by mouth 2 (two) times daily. ?  ?multivitamin Tabs tablet ?Take 1 tablet by mouth daily. ?  ?naproxen sodium 220 MG tablet ?Commonly known as: ALEVE ?Take 440 mg by m

## 2022-01-24 NOTE — Progress Notes (Addendum)
Mobility Specialist Progress Note  ? ? 01/24/22 1041  ?Mobility  ?Activity Ambulated independently in hallway  ?Level of Assistance Independent  ?Assistive Device None  ?Distance Ambulated (ft) 570 ft  ?Activity Response Tolerated well  ?$Mobility charge 1 Mobility  ? ?Pt received in doorway and agreeable. No complaints on walk. Returned to chair with call bell in reach.   ? ?Marvin Mckee ?Mobility Specialist  ?Primary: [address] Phone: [phone] ?Secondary: [address] Phone: [phone] ?  ?

## 2022-01-25 ENCOUNTER — Telehealth: Payer: Self-pay

## 2022-01-25 NOTE — Telephone Encounter (Signed)
Transition Care Management Unsuccessful Follow-up Telephone Call ? ?Date of discharge and from where:  [facility] on 01/24/2022 ? ?Attempts:  1st Attempt ? ?Reason for unsuccessful TCM follow-up call:  Left voice message unable to reach. Call back requested. ? ?  ?

## 2022-01-26 ENCOUNTER — Telehealth: Payer: Self-pay

## 2022-01-26 ENCOUNTER — Telehealth: Payer: Self-pay | Admitting: *Deleted

## 2022-01-26 LAB — CULTURE, BLOOD (ROUTINE X 2)
Culture: NO GROWTH
Culture: NO GROWTH

## 2022-01-26 NOTE — Telephone Encounter (Signed)
-----   Message from Kennieth Rad, PA-C sent at 01/21/2022 11:37 AM EDT ----- ?Please call patient and let him know that his thyroid function, kidney and liver function are within normal limits.  He does not show signs of anemia.  His screening for prostate cancer was negative.  His urine culture did not show any type of urinary tract infection.  He did have an elevated white blood cell count, this is more than likely due to his upper respiratory virus, it is important that he keep his follow-up appointment with Dr. Margarita Rana on May 17, and consider having it rechecked at that time. ?

## 2022-01-26 NOTE — Telephone Encounter (Signed)
Transition Care Management Unsuccessful Follow-up Telephone Call ?  ?Date of discharge and from where:  [facility] on 01/24/2022 ?  ?Attempts:  2nd Attempt ?  ?Reason for unsuccessful TCM follow-up call:  Left voice message unable to reach. Call back requested. ?  ? ?Patient has scheduled appt with Dr Margarita Rana on 02/08/2022 ?

## 2022-01-26 NOTE — Telephone Encounter (Signed)
MA informed patient of results per signed DPR. Patient should keep appointment with PCP on 5//17 with recheck WBC. ?

## 2022-01-27 ENCOUNTER — Telehealth: Payer: Self-pay

## 2022-01-27 NOTE — Telephone Encounter (Signed)
Transition Care Management Unsuccessful Follow-up Telephone Call ?  ?Date of discharge and from where:  [facility] on 01/24/2022 ?  ?Attempts:  3rd Attempt ?  ?Reason for unsuccessful TCM follow-up call:  Left voice message unable to reach. Call back requested. ?  ?  ?Patient has scheduled appt with Dr Margarita Rana on 02/08/2022 ?

## 2022-02-08 ENCOUNTER — Encounter: Payer: Self-pay | Admitting: Family Medicine

## 2022-02-08 ENCOUNTER — Ambulatory Visit: Payer: Self-pay | Attending: Family Medicine | Admitting: Family Medicine

## 2022-02-08 VITALS — BP 116/74 | HR 75 | Ht 72.0 in | Wt 158.4 lb

## 2022-02-08 DIAGNOSIS — G47419 Narcolepsy without cataplexy: Secondary | ICD-10-CM

## 2022-02-08 DIAGNOSIS — Z85528 Personal history of other malignant neoplasm of kidney: Secondary | ICD-10-CM

## 2022-02-08 DIAGNOSIS — R399 Unspecified symptoms and signs involving the genitourinary system: Secondary | ICD-10-CM

## 2022-02-08 DIAGNOSIS — Z1159 Encounter for screening for other viral diseases: Secondary | ICD-10-CM

## 2022-02-08 DIAGNOSIS — I10 Essential (primary) hypertension: Secondary | ICD-10-CM

## 2022-02-08 DIAGNOSIS — Z1211 Encounter for screening for malignant neoplasm of colon: Secondary | ICD-10-CM
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
## 2022-02-08 NOTE — Patient Instructions (Signed)
Narcolepsy Narcolepsy is a neurological disorder that causes people to fall asleep suddenly and without control (have sleep attacks) during the daytime. It is a lifelong disorder. Narcolepsy disrupts the sleep cycle at night, which then causes daytime sleepiness. What are the causes? The cause of narcolepsy is not fully understood, but it may be related to: Low levels of hypocretin, a chemical (neurotransmitter) in the brain that controls sleep and wake cycles. Hypocretin imbalance may be caused by: Abnormal genes that are passed from parent to child (inherited). An autoimmune disease in which the body's defense system (immune system) attacks the brain cells that make hypocretin. Infection, tumor, or injury in the area of the brain that controls sleep. Exposure to poisons (toxins), such as heavy metals, pesticides, and secondhand smoke. What are the signs or symptoms? Symptoms of this condition include: Excessive daytime sleepiness. This is the most common symptom and is usually the first symptom you will notice. This may affect your performance at work or school. Sleep attacks. You may fall asleep in the middle of an activity, especially low-energy activities like reading or watching TV. Feeling like you cannot think clearly and trouble focusing or remembering things. You may also feel depressed. Sudden muscle weakness (cataplexy). When this occurs, your speech may become slurred, or your knees may buckle. Cataplexy is usually triggered by surprise, anger, fear, or laughter. Losing the ability to speak or move (sleep paralysis). This may occur just as you start to fall asleep or wake up. You will be aware of the paralysis. It usually lasts for just a few seconds or minutes. Seeing, hearing, tasting, smelling, or feeling things that are not real (hallucinations). Hallucinations may occur with sleep paralysis. They can happen when you are falling asleep, waking up, or dozing. Trouble staying asleep  at night (insomnia) and restless sleep. How is this diagnosed? This condition may be diagnosed based on: A physical exam to rule out any other problems that may be causing your symptoms. You may be asked to write down your sleeping patterns for several weeks in a sleep diary. This will help your health care provider make a diagnosis. Sleep studies that measure how well your REM sleep is regulated. These tests also measure your heart rate, breathing, movement, and brain waves. These tests include: An overnight sleep study (polysomnogram). A daytime sleep study that is done while you take several naps during the day (multiple sleep latency test, MSLT). This test measures how quickly you fall asleep and how quickly you enter REM sleep. Removal of spinal fluid to measure hypocretin levels. How is this treated? There is no cure for this condition, but treatment can help relieve symptoms. Treatment may include: Lifestyle and sleeping strategies to help you cope with the condition, such as: Exercising regularly. Maintaining a regular sleep schedule. Avoiding caffeine and large meals before bed. Medicines. These may include: Medicines that help keep you awake and alert (stimulants) to fight daytime sleepiness. Medicines that treat depression (antidepressants). These may be used to treat cataplexy. Sodium oxybate. This is a strong medicine to help you relax (sedative) that you may take at night. It can help control daytime sleepiness and cataplexy. Other treatments may include mental health counseling or joining a support group. Follow these instructions at home: Sleeping habits  Get about 8 hours of sleep every night. Go to sleep and get up at about the same time every day. Keep your bedroom dark, quiet, and comfortable. When you feel very tired, take short naps. Schedule naps   so that you take them at about the same time every day. Before bedtime: Avoid bright lights and screens. Relax. Try  activities like reading or taking a warm bath. Activity Get at least 20 minutes of exercise every day. This will help you sleep better at night and reduce daytime sleepiness. Avoid exercising within 3 hours of bedtime. Do not drive or use heavy machinery if you are sleepy. If possible, take a nap before driving. Do not swim or go out on the water without a life jacket. Eating and drinking Do not drink alcohol or caffeinated beverages within 4-5 hours of bedtime. Do not eat a large meal before bedtime. Eat meals at about the same times every day. General instructions  Take over-the-counter and prescription medicines only as told by your health care provider. Keep a sleep diary as told by your health care provider. Tell your employer or teachers that you have narcolepsy. You may be able to adjust your schedule to include time for naps. Do not use any products that contain nicotine or tobacco, such as cigarettes, e-cigarettes, and chewing tobacco. If you need help quitting, ask your health care provider. Keep all follow-up visits as told by your health care provider. This is important. Where to find more information National Institute of Neurological Disorders: www.ninds.nih.gov Contact a health care provider if: Your symptoms are not getting better. You have increasingly high blood pressure (hypertension). You have changes in your heart rhythm. You are having a hard time determining what is real and what is not (psychosis). Get help right away if you: Hurt yourself during a sleep attack or an attack of cataplexy. Have chest pain. Have trouble breathing. These symptoms may represent a serious problem that is an emergency. Do not wait to see if the symptoms will go away. Get medical help right away. Call your local emergency services (911 in the [place] Do not drive yourself to the hospital. Summary Narcolepsy is a neurological disorder that causes people to fall asleep suddenly, and without  control, during the daytime (sleep attacks). It is a lifelong disorder. There is no cure for this condition, but treatment can help relieve symptoms. Go to sleep and get up at about the same time every day. Follow instructions about sleep and activities as told by your health care provider. Take over-the-counter and prescription medicines only as told by your health care provider. This information is not intended to replace advice given to you by your health care provider. Make sure you discuss any questions you have with your health care provider. Document Revised: 10/17/2021 Document Reviewed: 04/23/2019 Elsevier Patient Education  2023 [employer] Inc.  

## 2022-02-09 ENCOUNTER — Telehealth (HOSPITAL_COMMUNITY): Payer: Self-pay

## 2022-02-09 LAB — CBC WITH DIFFERENTIAL/PLATELET
Basophils Absolute: 0.1 10*3/uL (ref 0.0–0.2)
Basos: 2 %
EOS (ABSOLUTE): 0.3 10*3/uL (ref 0.0–0.4)
Eos: 7 %
Hematocrit: 39.2 % (ref 37.5–51.0)
Hemoglobin: 12.8 g/dL — ABNORMAL LOW (ref 13.0–17.7)
Immature Grans (Abs): 0 10*3/uL (ref 0.0–0.1)
Immature Granulocytes: 0 %
Lymphocytes Absolute: 2.1 10*3/uL (ref 0.7–3.1)
Lymphs: 53 %
MCH: 27.2 pg (ref 26.6–33.0)
MCHC: 32.7 g/dL (ref 31.5–35.7)
MCV: 83 fL (ref 79–97)
Monocytes Absolute: 0.4 10*3/uL (ref 0.1–0.9)
Monocytes: 11 %
Neutrophils Absolute: 1.1 10*3/uL — ABNORMAL LOW (ref 1.4–7.0)
Neutrophils: 27 %
Platelets: 405 10*3/uL (ref 150–450)
RBC: 4.7 x10E6/uL (ref 4.14–5.80)
RDW: 13.3 % (ref 11.6–15.4)
WBC: 4 10*3/uL (ref 3.4–10.8)

## 2022-02-09 LAB — LP+NON-HDL CHOLESTEROL
Cholesterol, Total: 168 mg/dL (ref 100–199)
HDL: 59 mg/dL (ref >39)
LDL Chol Calc (NIH): 100 mg/dL — ABNORMAL HIGH (ref 0–99)
Total Non-HDL-Chol (LDL+VLDL): 109 mg/dL (ref 0–129)
Triglycerides: 44 mg/dL (ref 0–149)
VLDL Cholesterol Cal: 9 mg/dL (ref 5–40)

## 2022-02-09 LAB — CMP14+EGFR
ALT: 30 IU/L (ref 0–44)
AST: 27 IU/L (ref 0–40)
Albumin/Globulin Ratio: 1.3 (ref 1.2–2.2)
Albumin: 4 g/dL (ref 3.8–4.9)
Alkaline Phosphatase: 83 IU/L (ref 44–121)
BUN/Creatinine Ratio: 12 (ref 9–20)
BUN: 11 mg/dL (ref 6–24)
Bilirubin Total: <0.2 mg/dL (ref 0.0–1.2)
CO2: 24 mmol/L (ref 20–29)
Calcium: 8.7 mg/dL (ref 8.7–10.2)
Chloride: 102 mmol/L (ref 96–106)
Creatinine, Ser: 0.9 mg/dL (ref 0.76–1.27)
Globulin, Total: 3 g/dL (ref 1.5–4.5)
Glucose: 91 mg/dL (ref 70–99)
Potassium: 4.9 mmol/L (ref 3.5–5.2)
Sodium: 139 mmol/L (ref 134–144)
Total Protein: 7 g/dL (ref 6.0–8.5)
eGFR: 102 mL/min/{1.73_m2} (ref >59)

## 2022-02-09 LAB — HCV AB W REFLEX TO QUANT PCR: HCV Ab: NONREACTIVE

## 2022-02-09 LAB — HCV INTERPRETATION

## 2022-02-14 ENCOUNTER — Telehealth (HOSPITAL_COMMUNITY): Payer: Self-pay | Admitting: Pharmacist

## 2022-02-14 ENCOUNTER — Other Ambulatory Visit (HOSPITAL_COMMUNITY): Payer: Self-pay

## 2022-02-14 NOTE — Telephone Encounter (Signed)
Transitions of Care Pharmacy   Call attempted for a pharmacy transitions of care follow-up. HIPAA appropriate voicemail was left with call back information provided.   Offer to transfer tamsulosin refills, pt due for refill on 02/23/22.  Call attempt #1.

## 2022-02-16 ENCOUNTER — Telehealth (HOSPITAL_COMMUNITY): Payer: Self-pay

## 2022-02-16 NOTE — Telephone Encounter (Signed)
Transitions of Care Pharmacy   Call attempted for a pharmacy transitions of care follow-up. HIPAA appropriate voicemail was left with call back information provided.   Call attempt #2. Will follow-up in 2-3 days.    

## 2022-02-23 ENCOUNTER — Other Ambulatory Visit (HOSPITAL_BASED_OUTPATIENT_CLINIC_OR_DEPARTMENT_OTHER): Payer: Self-pay

## 2022-02-23 ENCOUNTER — Telehealth (HOSPITAL_BASED_OUTPATIENT_CLINIC_OR_DEPARTMENT_OTHER): Payer: Self-pay

## 2022-02-23 NOTE — Telephone Encounter (Signed)
Transitions of Care Pharmacy   Call attempted for a pharmacy transitions of care follow-up. HIPAA appropriate voicemail was left with call back information provided.   Call attempt #3. Final attempt.  Darcus Austin, [place] 02/23/2022 9:23 AM

## 2022-08-03 ENCOUNTER — Emergency Department (HOSPITAL_COMMUNITY): Payer: Self-pay

## 2022-08-03 ENCOUNTER — Other Ambulatory Visit: Payer: Self-pay

## 2022-08-03 ENCOUNTER — Emergency Department (HOSPITAL_COMMUNITY)
Admission: EM | Admit: 2022-08-03 | Discharge: 2022-08-03 | Disposition: A | Discharge status: Home / Self Care | Payer: Self-pay | Attending: Emergency Medicine | Admitting: Emergency Medicine

## 2022-08-03 ENCOUNTER — Encounter (HOSPITAL_COMMUNITY): Payer: Self-pay | Admitting: Emergency Medicine

## 2022-08-03 DIAGNOSIS — R079 Chest pain, unspecified: Secondary | ICD-10-CM | POA: Insufficient documentation

## 2022-08-03 DIAGNOSIS — R1013 Epigastric pain: Secondary | ICD-10-CM | POA: Insufficient documentation

## 2022-08-03 DIAGNOSIS — R059 Cough, unspecified: Secondary | ICD-10-CM | POA: Insufficient documentation

## 2022-08-03 DIAGNOSIS — Z859 Personal history of malignant neoplasm, unspecified: Secondary | ICD-10-CM | POA: Insufficient documentation

## 2022-08-03 DIAGNOSIS — D72829 Elevated white blood cell count, unspecified: Secondary | ICD-10-CM | POA: Insufficient documentation

## 2022-08-03 DIAGNOSIS — R1110 Vomiting, unspecified: Secondary | ICD-10-CM | POA: Insufficient documentation

## 2022-08-03 DIAGNOSIS — I10 Essential (primary) hypertension: Secondary | ICD-10-CM | POA: Insufficient documentation

## 2022-08-03 DIAGNOSIS — N289 Disorder of kidney and ureter, unspecified: Secondary | ICD-10-CM

## 2022-08-03 DIAGNOSIS — R451 Restlessness and agitation: Secondary | ICD-10-CM | POA: Insufficient documentation

## 2022-08-03 LAB — CBC WITH DIFFERENTIAL/PLATELET
Abs Immature Granulocytes: 0.33 10*3/uL — ABNORMAL HIGH (ref 0.00–0.07)
Basophils Absolute: 0.1 10*3/uL (ref 0.0–0.1)
Basophils Relative: 1 %
Eosinophils Absolute: 0.2 10*3/uL (ref 0.0–0.5)
Eosinophils Relative: 1 %
HCT: 39.6 % (ref 39.0–52.0)
Hemoglobin: 13.2 g/dL (ref 13.0–17.0)
Immature Granulocytes: 2 %
Lymphocytes Relative: 11 %
Lymphs Abs: 2.4 10*3/uL (ref 0.7–4.0)
MCH: 27.6 pg (ref 26.0–34.0)
MCHC: 33.3 g/dL (ref 30.0–36.0)
MCV: 82.8 fL (ref 80.0–100.0)
Monocytes Absolute: 1.5 10*3/uL — ABNORMAL HIGH (ref 0.1–1.0)
Monocytes Relative: 7 %
Neutro Abs: 17.5 10*3/uL — ABNORMAL HIGH (ref 1.7–7.7)
Neutrophils Relative %: 78 %
Platelets: 295 10*3/uL (ref 150–400)
RBC: 4.78 MIL/uL (ref 4.22–5.81)
RDW: 13.4 % (ref 11.5–15.5)
WBC: 22.1 10*3/uL — ABNORMAL HIGH (ref 4.0–10.5)
nRBC: 0 % (ref 0.0–0.2)

## 2022-08-03 LAB — COMPREHENSIVE METABOLIC PANEL
ALT: 34 U/L (ref 0–44)
AST: 30 U/L (ref 15–41)
Albumin: 3.1 g/dL — ABNORMAL LOW (ref 3.5–5.0)
Alkaline Phosphatase: 72 U/L (ref 38–126)
Anion gap: 12 (ref 5–15)
BUN: 6 mg/dL (ref 6–20)
CO2: 24 mmol/L (ref 22–32)
Calcium: 8.5 mg/dL — ABNORMAL LOW (ref 8.9–10.3)
Chloride: 95 mmol/L — ABNORMAL LOW (ref 98–111)
Creatinine, Ser: 0.87 mg/dL (ref 0.61–1.24)
GFR, Estimated: >60 mL/min (ref >60)
Glucose, Bld: 104 mg/dL — ABNORMAL HIGH (ref 70–99)
Potassium: 3.5 mmol/L (ref 3.5–5.1)
Sodium: 131 mmol/L — ABNORMAL LOW (ref 135–145)
Total Bilirubin: 0.6 mg/dL (ref 0.3–1.2)
Total Protein: 7.6 g/dL (ref 6.5–8.1)

## 2022-08-03 LAB — LIPASE, BLOOD: Lipase: 31 U/L (ref 11–51)

## 2022-08-03 LAB — TYPE AND SCREEN
ABO/RH(D): B POS
Antibody Screen: NEGATIVE

## 2022-08-03 LAB — ABO/RH: ABO/RH(D): B POS

## 2022-08-03 LAB — TROPONIN I (HIGH SENSITIVITY)
Troponin I (High Sensitivity): 16 ng/L (ref <18)
Troponin I (High Sensitivity): 15 ng/L (ref <18)

## 2022-08-03 MED ORDER — IOHEXOL 350 MG/ML SOLN
75.0000 mL | Freq: Once | INTRAVENOUS | Status: AC | PRN
  Administered 2022-08-03: 75 mL via INTRAVENOUS

## 2022-08-03 MED ORDER — DOXYCYCLINE HYCLATE 100 MG PO TABS
100.0000 mg | ORAL_TABLET | Freq: Two times a day (BID) | ORAL | 0 refills | Status: DC
  Filled 2022-08-03: qty 14, 7d supply, fill #0

## 2022-08-03 MED ORDER — SODIUM CHLORIDE 0.9 % IV BOLUS
1000.0000 mL | Freq: Once | INTRAVENOUS | Status: AC
  Administered 2022-08-03: 1000 mL via INTRAVENOUS

## 2022-08-03 MED ORDER — CEFTRIAXONE SODIUM 1 G IJ SOLR
1.0000 g | Freq: Once | INTRAMUSCULAR | Status: AC
  Administered 2022-08-03: 1 g via INTRAVENOUS
  Filled 2022-08-03: qty 10

## 2022-08-03 MED ORDER — CHLORPROMAZINE HCL 25 MG PO TABS
25.0000 mg | ORAL_TABLET | Freq: Once | ORAL | Status: AC
  Administered 2022-08-03: 25 mg via ORAL
  Filled 2022-08-03: qty 1

## 2022-08-03 MED ORDER — SODIUM CHLORIDE 0.9 % IV SOLN
500.0000 mg | Freq: Once | INTRAVENOUS | Status: AC
  Administered 2022-08-03: 500 mg via INTRAVENOUS
  Filled 2022-08-03: qty 5

## 2022-08-03 NOTE — ED Provider Triage Note (Addendum)
Emergency Medicine Provider Triage Evaluation Note  MONTARIUS KITAGAWA , a 56 y.o. male  was evaluated in triage.  Pt complains of chest pain, abdominal pain, vomiting.  He reports similar symptoms in April and was diagnosed with PNA.  States ongoing hiccups, vomiting (some bloody per patient and sig other at bedside).  Admits to ongoing cocaine use, last use was this morning but reports pain better after use.  Denies fever/chills/sweats.  Denies heavy EtOH.  Review of Systems  Positive: Chest pain, vomiting, abdominal pain Negative: fever  Physical Exam  BP (!) 146/90 (BP Location: Right Arm)   Pulse (!) 107   Temp 98.9 F (37.2 C) (Oral)   Resp (!) 22   SpO2 93%   Gen:   Awake, no distress   Resp:  Normal effort  MSK:   Moves extremities without difficulty  Other:  Coughing and hiccups noted on exam  Medical Decision Making  Medically screening exam initiated at 2:02 AM.  Appropriate orders placed.  AVRAM DANIELSON was informed that the remainder of the evaluation will be completed by another provider, this initial triage assessment does not replace that evaluation, and the importance of remaining in the ED until their evaluation is complete.  Chest pain, abdominal pain, vomiting (some blood per patient).  Hx of similar earlier this year and diagnosed with PNA.  Reports ongoing cocaine use.  Will check EKG, labs, CXR.   Larene Pickett, PA-C 08/03/22 0208    Larene Pickett, PA-C 08/03/22 [phone]

## 2022-08-03 NOTE — Discharge Instructions (Addendum)
You need to follow with urology regarding the lesion on your right kidney.  You have had this for several months.  Return if worse.

## 2022-08-03 NOTE — ED Notes (Signed)
Ambulated patient with pulse ox. . Patient did well 99%. Patient is back on cardiac monitor at this time.

## 2022-08-03 NOTE — ED Triage Notes (Signed)
Patient reports right chest pain with mild SOB , dry cough , hiccups and hematemesis onset 4 days ago . Cocaine this morning .

## 2022-08-03 NOTE — ED Provider Notes (Signed)
[facility] EMERGENCY DEPARTMENT Provider Note   CSN: [id number] Arrival date & time: 08/03/22  0138     History  Chief Complaint  Patient presents with   Chest Pain    Marvin Mckee is a 56 y.o. male.  HPI     This is a 56 year old male who presents with chest pain, abdominal pain, vomiting.  Patient states he has had cough and hiccups as well.  He had similar symptoms in April and was diagnosed with pneumonia.  He was admitted to the hospital at that time.  He states that hiccups have improved; however he continues to have cough and right-sided flank and abdominal pain.  He states he has a history of cancer but "has not followed up."  He denies fevers.  No known sick contacts.  Home Medications Prior to Admission medications   Medication Sig Start Date End Date Taking? Authorizing Provider  benzonatate (TESSALON) 200 MG capsule Take 1 capsule (200 mg total) by mouth 2 (two) times daily as needed for cough. 01/24/22   Ghimire, Henreitta Leber, MD  cefdinir (OMNICEF) 300 MG capsule Take 1 capsule (300 mg total) by mouth 2 (two) times daily. 01/24/22   Ghimire, Henreitta Leber, MD  multivitamin (ONE-A-DAY MEN'S) TABS tablet Take 1 tablet by mouth daily.    [provider]  naproxen sodium (ALEVE) 220 MG tablet Take 440 mg by mouth daily as needed (pain/headache).    [provider]  pantoprazole (PROTONIX) 40 MG tablet Take 1 tablet (40 mg total) by mouth daily. 01/24/22   Ghimire, Henreitta Leber, MD  tamsulosin (FLOMAX) 0.4 MG CAPS capsule Take 1 capsule (0.4 mg total) by mouth daily. 01/24/22   Ghimire, Henreitta Leber, MD  vitamin C (ASCORBIC ACID) 500 MG tablet Take 500 mg by mouth daily.    [provider]      Allergies    Patient has no known allergies.    Review of Systems   Review of Systems  Constitutional:  Negative for fever.  Respiratory:  Positive for cough.   Cardiovascular:  Positive for chest pain.  Gastrointestinal:  Positive for  abdominal pain. Negative for nausea and vomiting.  All other systems reviewed and are negative.   Physical Exam Updated Vital Signs BP (!) 131/108   Pulse 90   Temp 97.9 F (36.6 C)   Resp 17   SpO2 98%  Physical Exam Vitals and nursing note reviewed.  Constitutional:      Appearance: He is well-developed.     Comments: Restless appearing  HENT:     Head: Normocephalic and atraumatic.  Eyes:     Pupils: Pupils are equal, round, and reactive to light.  Cardiovascular:     Rate and Rhythm: Normal rate and regular rhythm.     Heart sounds: Normal heart sounds. No murmur heard. Pulmonary:     Effort: Pulmonary effort is normal. No respiratory distress.     Breath sounds: Normal breath sounds. No wheezing.  Abdominal:     General: Bowel sounds are normal.     Palpations: Abdomen is soft.     Tenderness: There is no abdominal tenderness. There is no rebound.     Comments: Epigastric and right abdominal tenderness palpation, no rebound or guarding noted  Musculoskeletal:     Cervical back: Neck supple.  Lymphadenopathy:     Cervical: No cervical adenopathy.  Skin:    General: Skin is warm and dry.  Neurological:     Mental  Status: He is alert and oriented to person, place, and time.  Psychiatric:     Comments: Agitated but cooperative     ED Results / Procedures / Treatments   Labs (all labs ordered are listed, but only abnormal results are displayed) Labs Reviewed  CBC WITH DIFFERENTIAL/PLATELET - Abnormal; Notable for the following components:      Result Value   WBC 22.1 (*)    Neutro Abs 17.5 (*)    Monocytes Absolute 1.5 (*)    Abs Immature Granulocytes 0.33 (*)    All other components within normal limits  COMPREHENSIVE METABOLIC PANEL - Abnormal; Notable for the following components:   Sodium 131 (*)    Chloride 95 (*)    Glucose, Bld 104 (*)    Calcium 8.5 (*)    Albumin 3.1 (*)    All other components within normal limits  CULTURE, BLOOD (ROUTINE X 2)   CULTURE, BLOOD (ROUTINE X 2)  LIPASE, BLOOD  RAPID URINE DRUG SCREEN, HOSP PERFORMED  URINALYSIS, ROUTINE W REFLEX MICROSCOPIC  TYPE AND SCREEN  ABO/RH  TROPONIN I (HIGH SENSITIVITY)  TROPONIN I (HIGH SENSITIVITY)    EKG EKG Interpretation  Date/Time:  Thursday August 03 2022 02:11:04 EST Ventricular Rate:  97 PR Interval:  132 QRS Duration: 88 QT Interval:  322 QTC Calculation: 408 R Axis:   90 Text Interpretation: Normal sinus rhythm Rightward axis Biventricular hypertrophy Septal infarct , age undetermined Abnormal ECG When compared with ECG of 21-Feb-2019 17:38, PREVIOUS ECG IS PRESENT Confirmed by Thayer Jew [phone]) on 08/03/2022 5:22:29 AM  Radiology DG Chest 2 View  Result Date: 08/03/2022 CLINICAL DATA:  Chest pain, vomiting EXAM: CHEST - 2 VIEW COMPARISON:  01/21/2022 FINDINGS: Patchy bilateral lower lobe opacities, suspicious for pneumonia. No pleural effusion or pneumothorax. The heart is normal in size. Visualized osseous structures are within normal limits. IMPRESSION: Bilateral lower lobe pneumonia. Electronically Signed   By: Julian Hy M.D.   On: 08/03/2022 02:36    Procedures Procedures    Medications Ordered in ED Medications  sodium chloride 0.9 % bolus 1,000 mL (1,000 mLs Intravenous New Bag/Given 08/03/22 0555)  cefTRIAXone (ROCEPHIN) 1 g in sodium chloride 0.9 % 100 mL IVPB (0 g Intravenous Stopped 08/03/22 0640)  azithromycin (ZITHROMAX) 500 mg in sodium chloride 0.9 % 250 mL IVPB (500 mg Intravenous New Bag/Given 08/03/22 0639)  chlorproMAZINE (THORAZINE) tablet 25 mg (25 mg Oral Given 08/03/22 9678)    ED Course/ Medical Decision Making/ A&P                           Medical Decision Making Amount and/or Complexity of Data Reviewed Radiology: ordered.  Risk Prescription drug management.   This patient presents to the ED for concern of cough, abdominal pain, chest pain, this involves an extensive number of treatment options, and is  a complaint that carries with it a high risk of complications and morbidity.  I considered the following differential and admission for this acute, potentially life threatening condition.  The differential diagnosis includes ACS, pneumothorax, pneumonia, pancreatitis, gastritis  MDM:    This is a 56 year old male who presents with cough, abdominal pain, chest pain.  He is nontoxic.  Vital signs are reassuring with exception of a blood pressure of 131/108.  He has some abdominal tenderness on exam.  EKG shows no evidence of acute ischemia.  Troponin negative.  Labs obtained and reviewed.  He has a leukocytosis to  22.1.  Normal lipase and LFTs.  No significant metabolic derangements.  Chest x-ray is concerning for possible pneumonia.  He has a history of the same in April 2023.  He was given a dose of Rocephin and azithromycin.  Given abdominal discomfort, will obtain CT scan.  CT imaging is pending.  (Labs, imaging, consults)  Labs: I Ordered, and personally interpreted labs.  The pertinent results include: CBC, BMP, lipase,  Imaging Studies ordered: I ordered imaging studies including chest x-ray, CT pending I independently visualized and interpreted imaging. I agree with the radiologist interpretation  Additional history obtained from chart review.  External records from outside source obtained and reviewed including prior evaluations  Cardiac Monitoring: The patient was maintained on a cardiac monitor.  I personally viewed and interpreted the cardiac monitored which showed an underlying rhythm of: Normal sinus rhythm  Reevaluation: After the interventions noted above, I reevaluated the patient and found that they have :stayed the same  Social Determinants of Health:  cocaine abuse  Disposition:  pending  Co morbidities that complicate the patient evaluation  Past Medical History:  Diagnosis Date   Back pain    Cancer ([place])    Cocaine abuse ([place])    DDD (degenerative disc  disease), lumbar    Hypertension      Medicines Meds ordered this encounter  Medications   sodium chloride 0.9 % bolus 1,000 mL   cefTRIAXone (ROCEPHIN) 1 g in sodium chloride 0.9 % 100 mL IVPB    Order Specific Question:   Antibiotic Indication:    Answer:   CAP   azithromycin (ZITHROMAX) 500 mg in sodium chloride 0.9 % 250 mL IVPB   chlorproMAZINE (THORAZINE) tablet 25 mg    I have reviewed the patients home medicines and have made adjustments as needed  Problem List / ED Course: Problem List Items Addressed This Visit   None               Final Clinical Impression(s) / ED Diagnoses Final diagnoses:  None    Rx / DC Orders ED Discharge Orders     None         Merryl Hacker, MD 08/03/22 [phone]

## 2022-08-03 NOTE — ED Notes (Signed)
Patient Alert and oriented to baseline. Stable and ambulatory to baseline. Patient verbalized understanding of the discharge instructions.  Patient belongings were taken by the patient.   

## 2022-08-03 NOTE — ED Notes (Addendum)
PATIENT HAS BEEN CALL TO A ROOM

## 2022-08-03 NOTE — ED Provider Notes (Signed)
Pt signed out by Dr. Dina Rich pending CT scan abd/pelvis.  CT scan reviewed.  I agree with the radiologist.    1. Right middle lobe, right lower lobe and left lower lobe  infiltrates consistent with polylobar pneumonia.  2. 12 mm lesion projecting off the lower pole region of the right  kidney could be a hyperdense/hemorrhagic cyst but could not exclude  a small enhancing renal neoplasm. Recommend follow-up MRI without  and with contrast for further evaluation.  3. Stable central right hepatic lobe hemangioma.  4. Age advanced atherosclerotic calcifications involving the aorta  and iliac arteries.  5. Aortic atherosclerosis.    Aortic Atherosclerosis (ICD10-I70.0).    Pt is able to ambulate with out any problems.  O2 sat stays 99%.   Pt has a lesion in the right kidney which will need to be worked ou as an outpatient.  He has a hx of RCC.  It looks like this was seen during his hospitalization in April.  He did f/u with pcp in May and pt was told to keep appt with urology.  It is unclear if pt did so as pt is a poor historian and there is nothing in epic.  Pt is given the number of [facility] to f/u.  He was given rocephin/zithromax in ED.  He is d/c with doxy.   Isla Pence, MD 08/03/22 1032

## 2022-08-08 LAB — CULTURE, BLOOD (ROUTINE X 2)
Culture: NO GROWTH
Special Requests: ADEQUATE

## 2022-08-09 ENCOUNTER — Other Ambulatory Visit: Payer: Self-pay

## 2022-08-14 ENCOUNTER — Encounter: Payer: Self-pay | Admitting: Family Medicine

## 2022-08-14 ENCOUNTER — Other Ambulatory Visit: Payer: Self-pay

## 2022-08-14 ENCOUNTER — Ambulatory Visit: Payer: Self-pay | Attending: Family Medicine | Admitting: Family Medicine

## 2022-08-14 VITALS — BP 123/75 | HR 72 | Wt 153.2 lb

## 2022-08-14 DIAGNOSIS — I7090 Unspecified atherosclerosis: Secondary | ICD-10-CM | POA: Insufficient documentation

## 2022-08-14 DIAGNOSIS — R399 Unspecified symptoms and signs involving the genitourinary system: Secondary | ICD-10-CM

## 2022-08-14 DIAGNOSIS — N2889 Other specified disorders of kidney and ureter: Secondary | ICD-10-CM

## 2022-08-14 DIAGNOSIS — N528 Other male erectile dysfunction: Secondary | ICD-10-CM

## 2022-08-14 DIAGNOSIS — I10 Essential (primary) hypertension: Secondary | ICD-10-CM

## 2022-08-14 DIAGNOSIS — G47419 Narcolepsy without cataplexy: Secondary | ICD-10-CM

## 2022-08-14 DIAGNOSIS — Z1211 Encounter for screening for malignant neoplasm of colon: Secondary | ICD-10-CM

## 2022-08-14 DIAGNOSIS — F1410 Cocaine abuse, uncomplicated: Secondary | ICD-10-CM

## 2022-08-14 DIAGNOSIS — Z85528 Personal history of other malignant neoplasm of kidney: Secondary | ICD-10-CM

## 2022-08-14 DIAGNOSIS — J189 Pneumonia, unspecified organism: Secondary | ICD-10-CM

## 2022-08-14 MED ORDER — ATORVASTATIN CALCIUM 20 MG PO TABS
20.0000 mg | ORAL_TABLET | Freq: Every day | ORAL | 6 refills | Status: DC
  Filled 2022-08-14: qty 30, 30d supply, fill #0

## 2022-08-14 MED ORDER — TADALAFIL 10 MG PO TABS
10.0000 mg | ORAL_TABLET | ORAL | 1 refills | Status: DC | PRN
  Filled 2022-08-14: qty 10, 20d supply, fill #0
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
## 2022-12-11 IMAGING — DX DG CHEST 1V PORT
1 series · 1 of 1 positions shown · non-contrast
Comparison: 02/21/2019

CLINICAL DATA: Cough and possible sepsis

EXAM:
PORTABLE CHEST 1 VIEW

[chest ap]
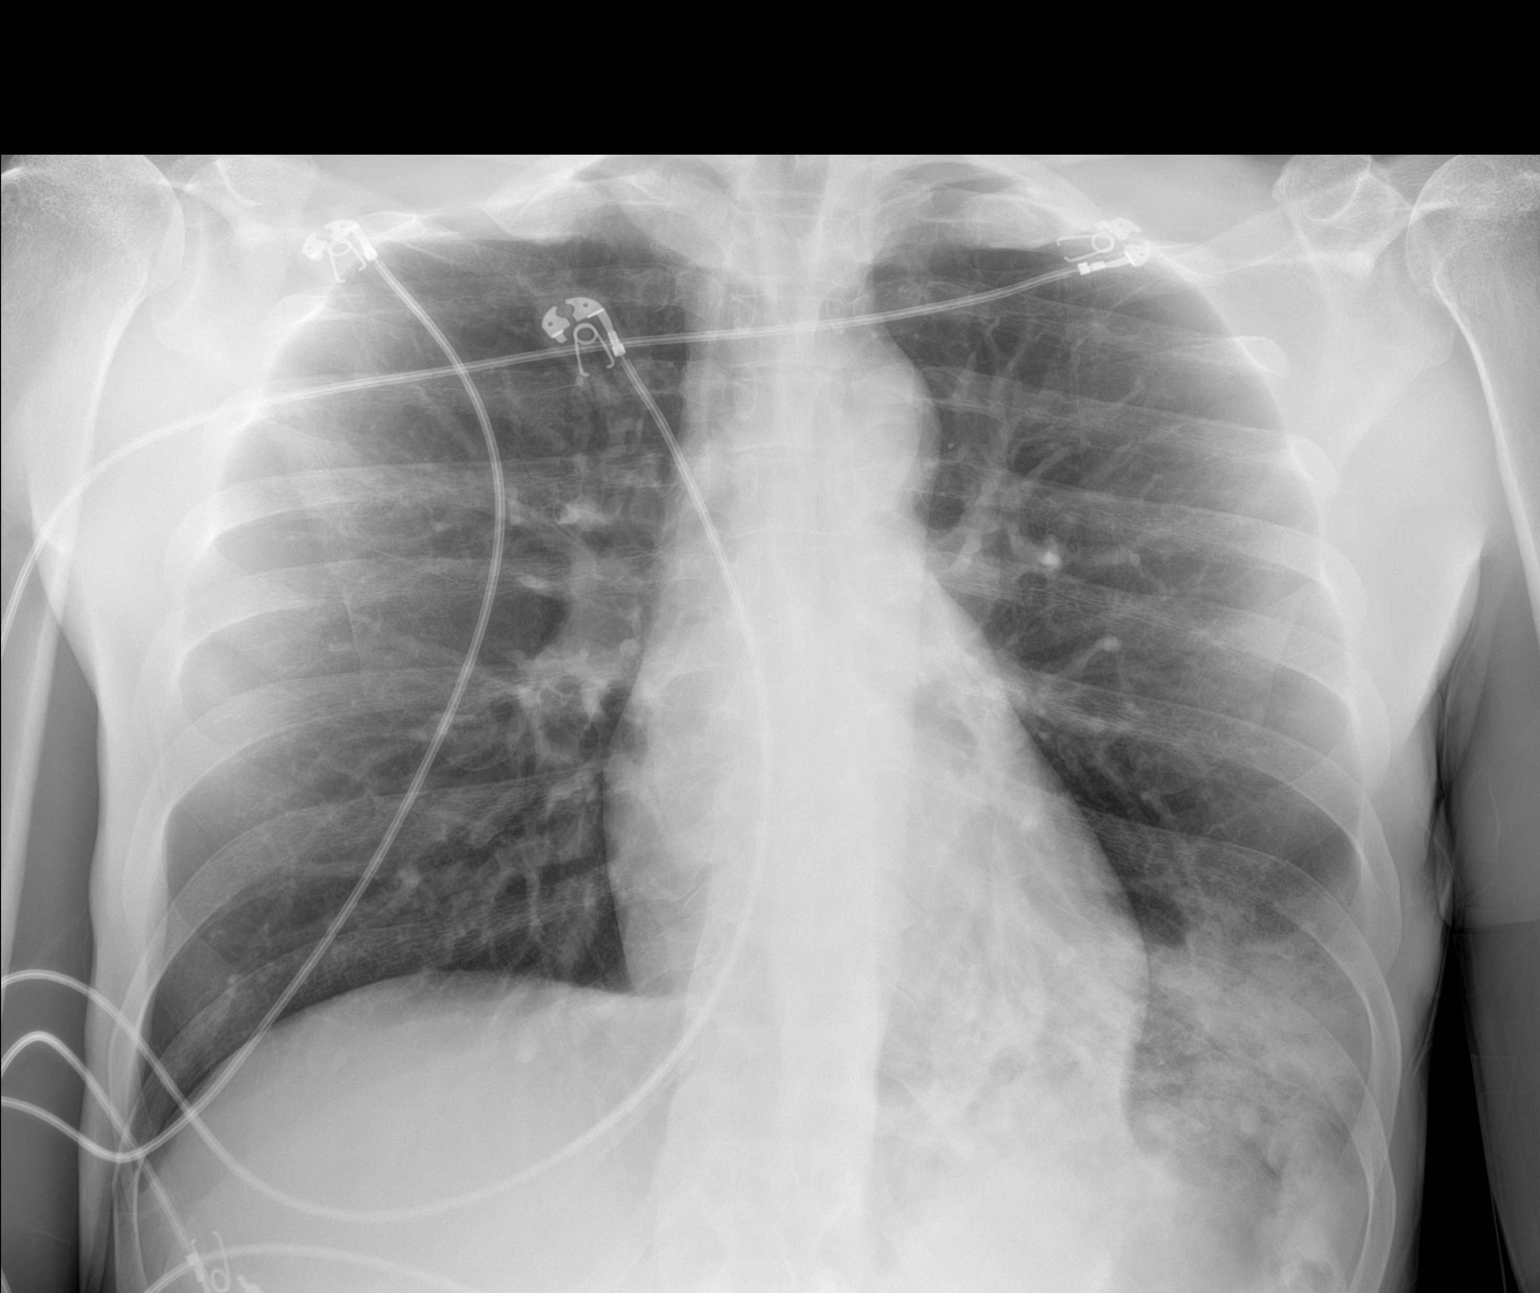

[1 of 1 positions shown; findings below may reference images not displayed]

FINDINGS: Cardiac shadow is within normal limits. The lungs are well aerated
bilaterally. Diffuse left basilar infiltrate is noted consistent
with acute pneumonia. No bony abnormality is seen.
IMPRESSION: Left basilar pneumonia.

## 2022-12-11 IMAGING — CT CT ABD-PELV W/ CM
2 of 5 series · 16 of 46 positions shown, 18 images · IV contrast (Omni 300)
Comparison: 03/23/2014.

CLINICAL DATA: Abdominal pain, acute, nonlocalized.

EXAM:
CT ABDOMEN AND PELVIS WITH CONTRAST
TECHNIQUE: Multidetector CT imaging of the abdomen and pelvis was performed
using the standard protocol following bolus administration of
intravenous contrast.

[Series 3: a/p w/ 5mm · axial · 0.73mm/px · z∈[-432,-12]mm · 13 of 94 slices shown, 15 images]
[im 5/94  soft-tissue]
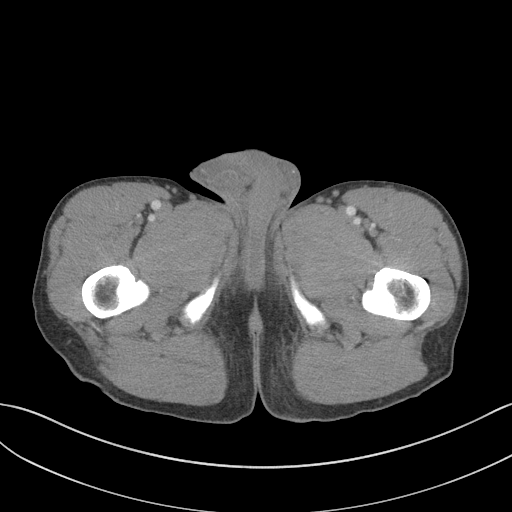
[im 5/94  bone]
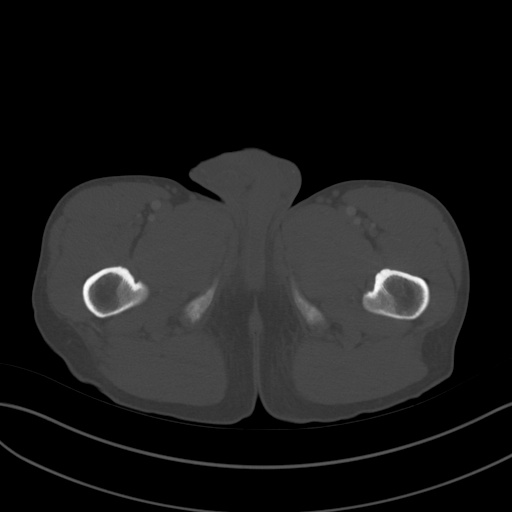
[im 15/94  soft-tissue]
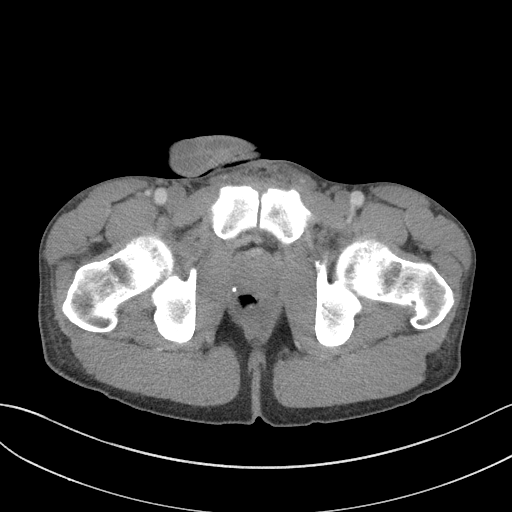
[im 20/94  soft-tissue]
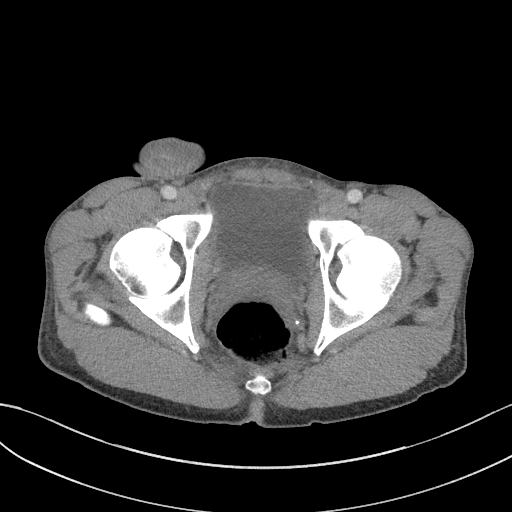
[im 25/94  soft-tissue]
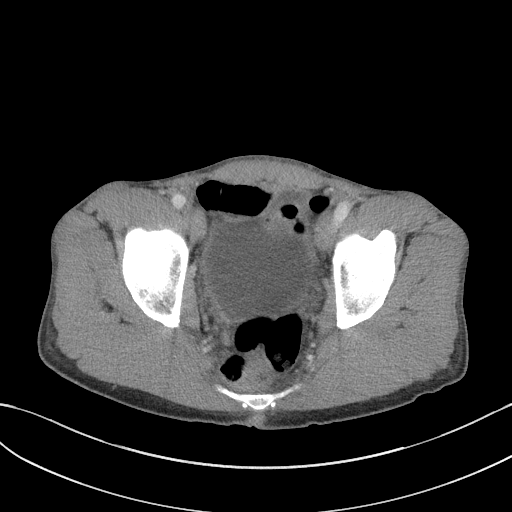
[im 35/94  soft-tissue]
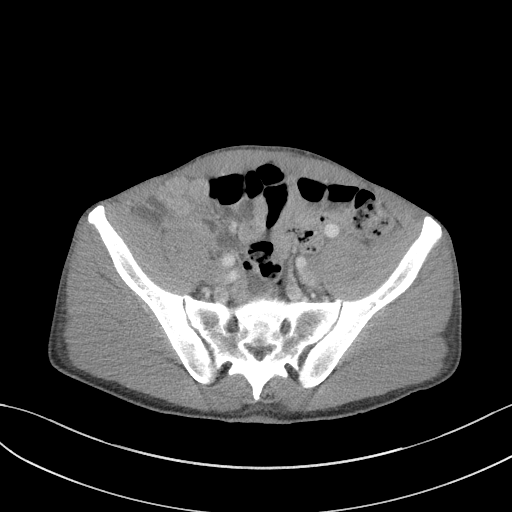
[im 40/94  soft-tissue]
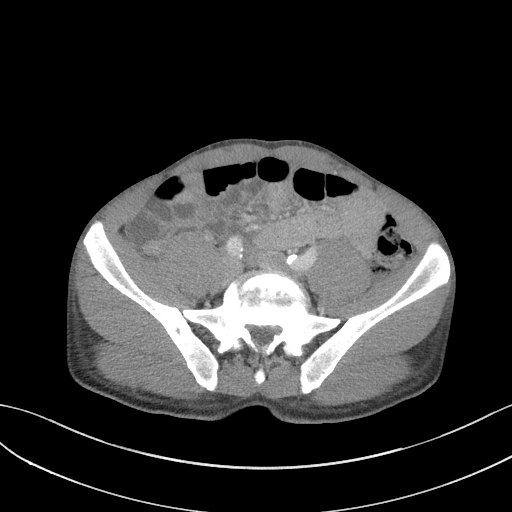
[im 49/94  soft-tissue]
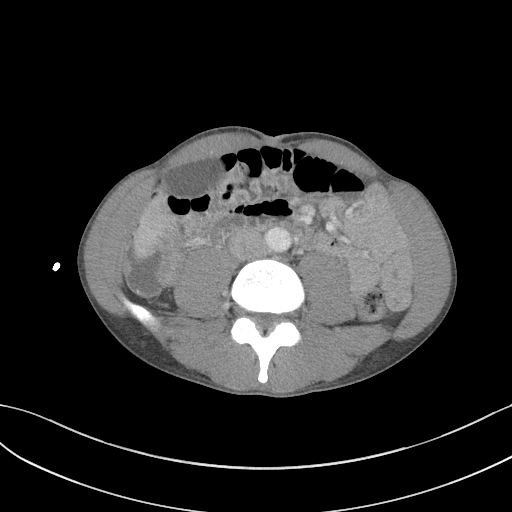
[im 54/94  soft-tissue]
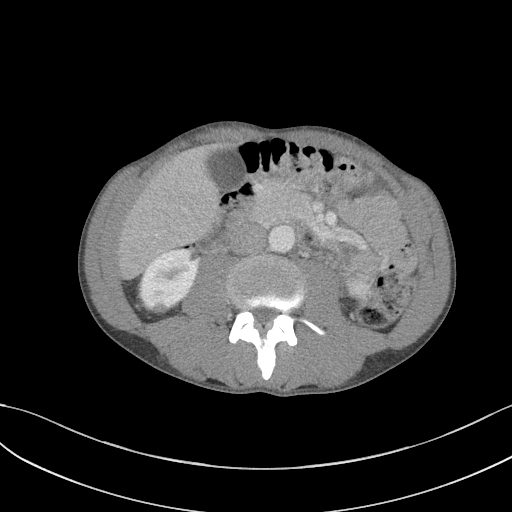
[im 59/94  soft-tissue]
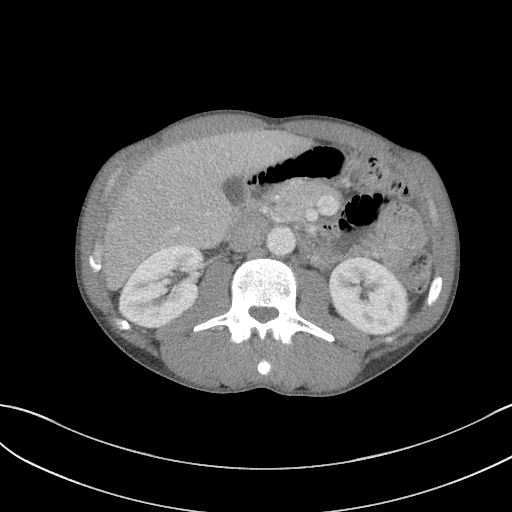
[im 59/94  bone]
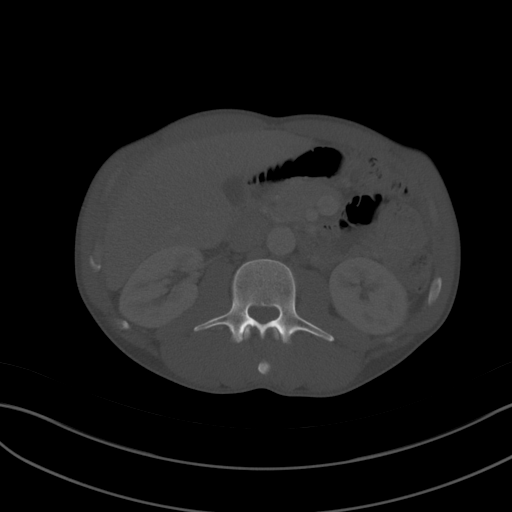
[im 69/94  soft-tissue]
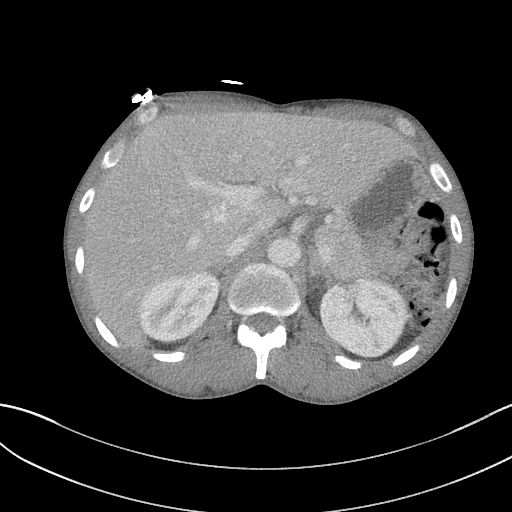
[im 74/94  soft-tissue]
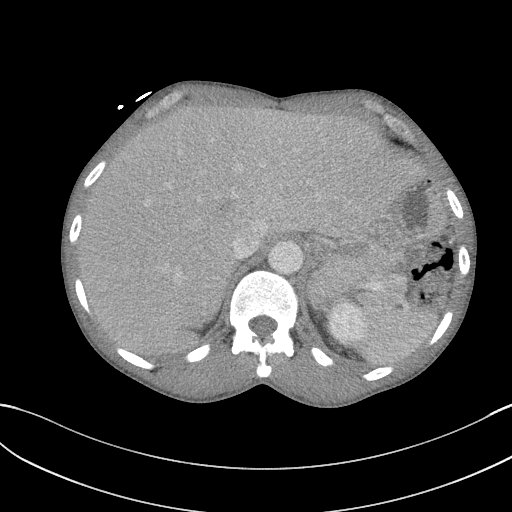
[im 79/94  soft-tissue]
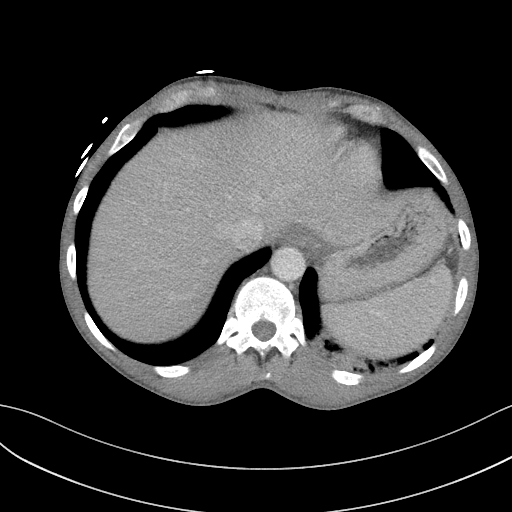
[im 89/94  soft-tissue]
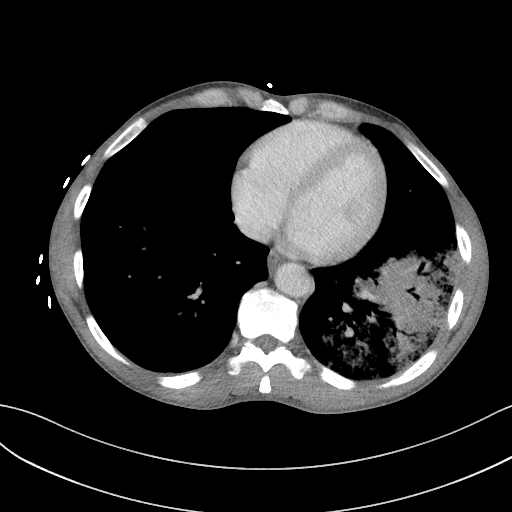

[Series 7: a/p w/ cor · coronal · 0.70mm/px · 3 of 149 slices shown]
[im 50/149  soft-tissue]
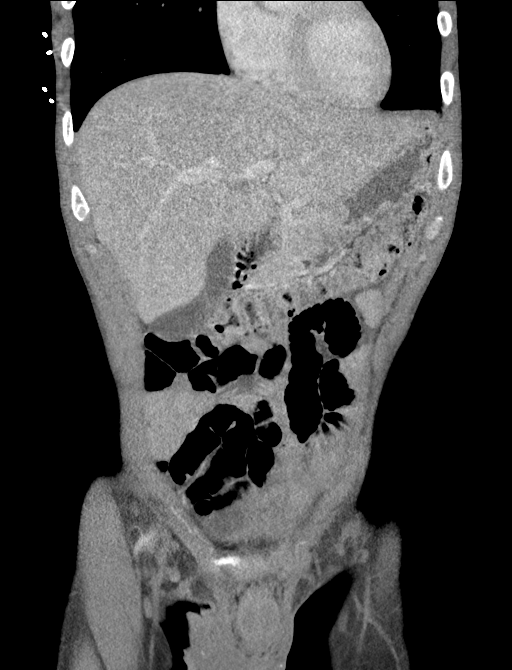
[im 66/149  soft-tissue]
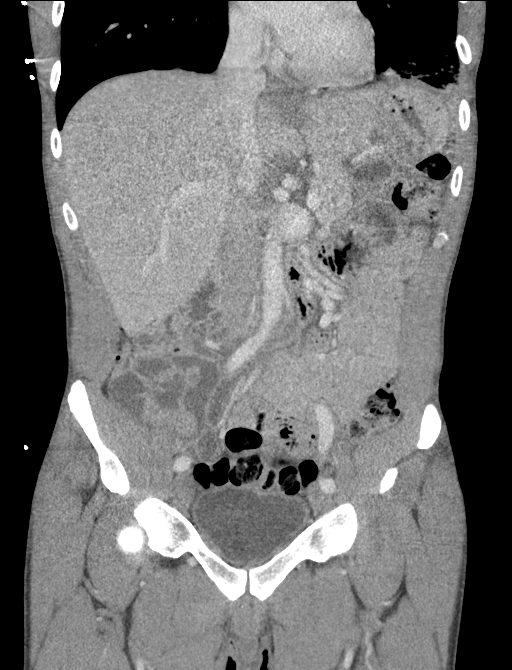
[im 83/149  soft-tissue]
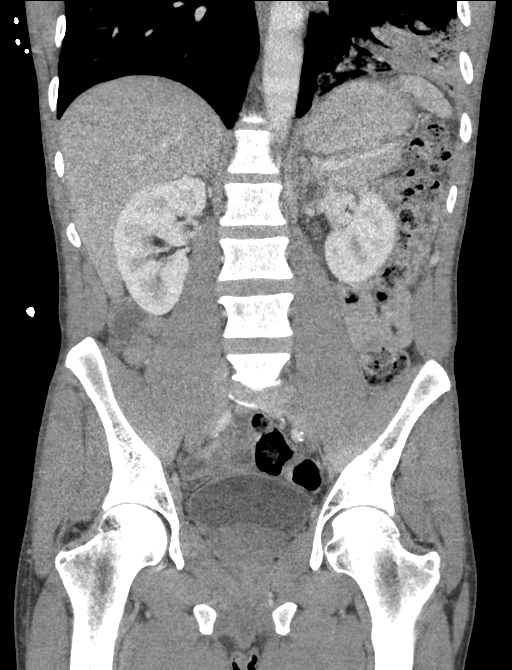

[16 of 46 positions shown; findings below may reference images not displayed]

RADIATION DOSE REDUCTION: This exam was performed according to the
departmental dose-optimization program which includes automated
exposure control, adjustment of the mA and/or kV according to
patient size and/or use of iterative reconstruction technique.

CONTRAST:  100mL OMNIPAQUE IOHEXOL 300 MG/ML  SOLN
FINDINGS: Lower chest: Patchy infiltrates and consolidation are noted in the
left lower lobe, suspicious for pneumonia. There is a 3 mm nodule in
the right lower lobe, axial image 1, unchanged from 2101.

Hepatobiliary: No focal liver abnormality is seen. No gallstones,
gallbladder wall thickening, or biliary dilatation.

Pancreas: Unremarkable. No pancreatic ductal dilatation or
surrounding inflammatory changes.

Spleen: Normal in size without focal abnormality.

Adrenals/Urinary Tract: The adrenal glands are within normal limits.
No renal calculus or hydronephrosis bilaterally. The kidneys enhance
symmetrically. A complex hypodensity is present in the lower pole of
the right kidney measuring 1.1 cm. Bladder is unremarkable.

Stomach/Bowel: Stomach is within normal limits. A normal appendix is
seen in the mid right abdomen along the inferior aspect of the
liver. No evidence of bowel wall thickening, distention, or
inflammatory changes. No free air or pneumatosis.

Vascular/Lymphatic: Aortic atherosclerosis. No enlarged abdominal or
pelvic lymph nodes.

Reproductive: Prostate is unremarkable.

Other: No abdominal wall hernia or abnormality. No abdominopelvic
ascites.

Musculoskeletal: Mild retrolisthesis and degenerative changes at
L5-S1. No acute osseous abnormality.
IMPRESSION: 1. Patchy airspace disease and consolidation in the left lower lobe,
suspicious for pneumonia.
2. No acute process in the abdomen and pelvis.

## 2023-02-09 ENCOUNTER — Telehealth: Payer: Self-pay

## 2023-02-09 NOTE — Telephone Encounter (Signed)
   Telephone encounter was:  Successful.  02/09/2023 Name: Marvin Mckee MRN: [id number] DOB: [birth year]  Marvin Mckee is a 57 y.o. year old male who is a primary care patient of Hoy Register, MD . The community resource team was consulted for assistance with  Colorectal Cancer Screening  Care guide performed the following interventions: Patient provided with information about care guide support team and interviewed to confirm resource needs.Patient stated his insurance has changes and he has an appointment on 5/20 and will follow up with DR. Newlin about cologuard test fit   Follow Up Plan:  No further follow up planned at this time. The patient has been provided with needed resources.    Lenard Forth [facility] Guide, [facility] [phone] [address] [place], [place], [place] [address] Phone: [phone] Email: [email or web address]

## 2023-02-12 ENCOUNTER — Ambulatory Visit: Payer: Self-pay | Admitting: Family Medicine

## 2023-04-30 ENCOUNTER — Ambulatory Visit: Payer: Self-pay | Admitting: Family Medicine

## 2023-04-30 DIAGNOSIS — I7090 Unspecified atherosclerosis: Secondary | ICD-10-CM

## 2023-04-30 DIAGNOSIS — N39498 Other specified urinary incontinence: Secondary | ICD-10-CM

## 2023-07-06 ENCOUNTER — Emergency Department (HOSPITAL_BASED_OUTPATIENT_CLINIC_OR_DEPARTMENT_OTHER): Payer: Self-pay

## 2023-07-06 ENCOUNTER — Other Ambulatory Visit: Payer: Self-pay

## 2023-07-06 ENCOUNTER — Emergency Department (HOSPITAL_BASED_OUTPATIENT_CLINIC_OR_DEPARTMENT_OTHER)
Admission: EM | Admit: 2023-07-06 | Discharge: 2023-07-06 | Disposition: A | Discharge status: Home / Self Care | Payer: Self-pay | Attending: Emergency Medicine | Admitting: Emergency Medicine

## 2023-07-06 ENCOUNTER — Encounter (HOSPITAL_BASED_OUTPATIENT_CLINIC_OR_DEPARTMENT_OTHER): Payer: Self-pay | Admitting: Emergency Medicine

## 2023-07-06 DIAGNOSIS — S0990XA Unspecified injury of head, initial encounter: Secondary | ICD-10-CM | POA: Insufficient documentation

## 2023-07-06 DIAGNOSIS — Z79899 Other long term (current) drug therapy: Secondary | ICD-10-CM | POA: Insufficient documentation

## 2023-07-06 DIAGNOSIS — S01511A Laceration without foreign body of lip, initial encounter: Secondary | ICD-10-CM | POA: Insufficient documentation

## 2023-07-06 DIAGNOSIS — M542 Cervicalgia: Secondary | ICD-10-CM | POA: Insufficient documentation

## 2023-07-06 DIAGNOSIS — R079 Chest pain, unspecified: Secondary | ICD-10-CM | POA: Insufficient documentation

## 2023-07-06 DIAGNOSIS — F17210 Nicotine dependence, cigarettes, uncomplicated: Secondary | ICD-10-CM | POA: Insufficient documentation

## 2023-07-06 DIAGNOSIS — Z23 Encounter for immunization: Secondary | ICD-10-CM | POA: Insufficient documentation

## 2023-07-06 DIAGNOSIS — I10 Essential (primary) hypertension: Secondary | ICD-10-CM | POA: Insufficient documentation

## 2023-07-06 LAB — BASIC METABOLIC PANEL
Anion gap: 5 (ref 5–15)
BUN: 11 mg/dL (ref 6–20)
CO2: 33 mmol/L — ABNORMAL HIGH (ref 22–32)
Calcium: 9.1 mg/dL (ref 8.9–10.3)
Chloride: 103 mmol/L (ref 98–111)
Creatinine, Ser: 0.89 mg/dL (ref 0.61–1.24)
GFR, Estimated: >60 mL/min (ref >60)
Glucose, Bld: 96 mg/dL (ref 70–99)
Potassium: 4.3 mmol/L (ref 3.5–5.1)
Sodium: 141 mmol/L (ref 135–145)

## 2023-07-06 MED ORDER — TETANUS-DIPHTH-ACELL PERTUSSIS 5-2.5-18.5 LF-MCG/0.5 IM SUSY
0.5000 mL | PREFILLED_SYRINGE | Freq: Once | INTRAMUSCULAR | Status: AC
  Administered 2023-07-06: 0.5 mL via INTRAMUSCULAR
  Filled 2023-07-06: qty 0.5

## 2023-07-06 MED ORDER — ACETAMINOPHEN 325 MG PO TABS
650.0000 mg | ORAL_TABLET | Freq: Once | ORAL | Status: AC
  Administered 2023-07-06: 650 mg via ORAL
  Filled 2023-07-06: qty 2

## 2023-07-06 MED ORDER — IOHEXOL 300 MG/ML  SOLN
80.0000 mL | Freq: Once | INTRAMUSCULAR | Status: AC | PRN
  Administered 2023-07-06: 80 mL via INTRAVENOUS

## 2023-07-06 MED ORDER — LIDOCAINE HCL (PF) 1 % IJ SOLN
10.0000 mL | Freq: Once | INTRAMUSCULAR | Status: AC
  Administered 2023-07-06: 10 mL
  Filled 2023-07-06: qty 10

## 2023-07-06 NOTE — Discharge Instructions (Addendum)
As discussed, imaging studies were reassuring.  No evidence of brain bleed, facial fracture, rib fracture or other abnormality appreciated on CT imaging.  Your laceration was repaired using absorbable sutures.  These will fall out as wound heals.  Recommend treatment of pain at home with Tylenol/Motrin as well as icing areas that are swollen from head trauma.  Recommend close follow-up with primary care for reassessment of your symptoms.  Please do not hesitate to return to emergency department if there are worrisome signs and symptoms we discussed to become apparent.
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
## 2023-07-16 ENCOUNTER — Ambulatory Visit: Payer: Self-pay | Admitting: Family Medicine

## 2023-07-30 ENCOUNTER — Ambulatory Visit: Payer: Self-pay | Admitting: Family Medicine

## 2023-08-07 ENCOUNTER — Ambulatory Visit: Payer: Self-pay | Admitting: Family Medicine

## 2023-08-14 ENCOUNTER — Other Ambulatory Visit: Payer: Self-pay

## 2023-08-14 ENCOUNTER — Other Ambulatory Visit (HOSPITAL_BASED_OUTPATIENT_CLINIC_OR_DEPARTMENT_OTHER): Payer: Self-pay

## 2023-08-14 ENCOUNTER — Emergency Department (HOSPITAL_BASED_OUTPATIENT_CLINIC_OR_DEPARTMENT_OTHER): Payer: Self-pay | Admitting: Radiology

## 2023-08-14 ENCOUNTER — Emergency Department (HOSPITAL_BASED_OUTPATIENT_CLINIC_OR_DEPARTMENT_OTHER)
Admission: EM | Admit: 2023-08-14 | Discharge: 2023-08-14 | Disposition: A | Discharge status: Home / Self Care | Payer: Self-pay | Attending: Emergency Medicine | Admitting: Emergency Medicine

## 2023-08-14 ENCOUNTER — Encounter (HOSPITAL_BASED_OUTPATIENT_CLINIC_OR_DEPARTMENT_OTHER): Payer: Self-pay | Admitting: *Deleted

## 2023-08-14 DIAGNOSIS — L03019 Cellulitis of unspecified finger: Secondary | ICD-10-CM

## 2023-08-14 DIAGNOSIS — L03011 Cellulitis of right finger: Secondary | ICD-10-CM | POA: Insufficient documentation

## 2023-08-14 MED ORDER — HYDROCODONE-ACETAMINOPHEN 5-325 MG PO TABS
1.0000 | ORAL_TABLET | Freq: Once | ORAL | Status: AC
  Administered 2023-08-14: 1 via ORAL
  Filled 2023-08-14: qty 1

## 2023-08-14 MED ORDER — DOXYCYCLINE HYCLATE 100 MG PO TABS
100.0000 mg | ORAL_TABLET | Freq: Once | ORAL | Status: AC
  Administered 2023-08-14: 100 mg via ORAL
  Filled 2023-08-14: qty 1

## 2023-08-14 MED ORDER — DOXYCYCLINE HYCLATE 100 MG PO CAPS
100.0000 mg | ORAL_CAPSULE | Freq: Two times a day (BID) | ORAL | 0 refills | Status: AC
  Filled 2023-08-14 – 2023-08-24 (×2): qty 14, 7d supply, fill #0

## 2023-08-14 MED ORDER — LIDOCAINE HCL (PF) 1 % IJ SOLN
30.0000 mL | Freq: Once | INTRAMUSCULAR | Status: AC
  Administered 2023-08-14: 5 mL
  Filled 2023-08-14: qty 30

## 2023-08-14 NOTE — Discharge Instructions (Addendum)
You are being placed on antibiotics due to concern for infection of the finger, return for any severe worsening symptoms and failure to improve despite outpatient antibiotics.  Signs of flexor tenosynovitis are attached.

## 2023-08-14 NOTE — ED Provider Notes (Signed)
[place] EMERGENCY DEPARTMENT AT [facility] Provider Note   CSN: [id number] Arrival date & time: 08/14/23  4098     History  Chief Complaint  Patient presents with   Hand Pain    Marvin Mckee is a 57 y.o. male.   Hand Pain     57 year old male presenting to the emergency department with thumb pain on the right.  He endorses distal pain and swelling for the last 3 days.  He is able to flex and extend the thumb, denies any fevers or chills.  No known injury to the thumb that he can think of.  Home Medications Prior to Admission medications   Medication Sig Start Date End Date Taking? Authorizing Provider  doxycycline (VIBRAMYCIN) 100 MG capsule Take 1 capsule (100 mg total) by mouth 2 (two) times daily for 7 days. 08/14/23 08/21/23 Yes Ernie Avena, MD  atorvastatin (LIPITOR) 20 MG tablet Take 1 tablet (20 mg total) by mouth daily. 08/14/22   Hoy Register, MD  benzonatate (TESSALON) 200 MG capsule Take 1 capsule (200 mg total) by mouth 2 (two) times daily as needed for cough. 01/24/22   Ghimire, Werner Lean, MD  cefdinir (OMNICEF) 300 MG capsule Take 1 capsule (300 mg total) by mouth 2 (two) times daily. 01/24/22   Ghimire, Werner Lean, MD  multivitamin (ONE-A-DAY MEN'S) TABS tablet Take 1 tablet by mouth daily.    [provider]  naproxen sodium (ALEVE) 220 MG tablet Take 440 mg by mouth daily as needed (pain/headache).    [provider]  pantoprazole (PROTONIX) 40 MG tablet Take 1 tablet (40 mg total) by mouth daily. 01/24/22   Ghimire, Werner Lean, MD  tadalafil (CIALIS) 10 MG tablet Take 1 tablet (10 mg total) by mouth every other day as needed for erectile dysfunction. 08/14/22   Hoy Register, MD  tamsulosin (FLOMAX) 0.4 MG CAPS capsule Take 1 capsule (0.4 mg total) by mouth daily. 01/24/22   Ghimire, Werner Lean, MD  vitamin C (ASCORBIC ACID) 500 MG tablet Take 500 mg by mouth daily.    [provider]      Allergies    Patient has  no known allergies.    Review of Systems   Review of Systems  All other systems reviewed and are negative.   Physical Exam Updated Vital Signs BP (!) 159/94 (BP Location: Left Arm)   Pulse 73   Temp 98.3 F (36.8 C) (Oral)   Resp 18   Ht 6' (1.829 m)   Wt 69.9 kg   SpO2 100%   BMI 20.89 kg/m  Physical Exam Vitals and nursing note reviewed.  Constitutional:      General: He is not in acute distress. HENT:     Head: Normocephalic and atraumatic.  Eyes:     Conjunctiva/sclera: Conjunctivae normal.     Pupils: Pupils are equal, round, and reactive to light.  Cardiovascular:     Rate and Rhythm: Normal rate and regular rhythm.  Pulmonary:     Effort: Pulmonary effort is normal. No respiratory distress.  Abdominal:     General: There is no distension.     Tenderness: There is no guarding.  Musculoskeletal:        General: No deformity or signs of injury.     Cervical back: Neck supple.     Comments: Tenderness to palpation about the pad of the distal right thumb, swelling present, mild fusiform swelling of the thumb however the thumb is not held in  flexion, there is no pain with passive extension and there is no tenderness about the proximal tendon sheath.  Skin:    Findings: No lesion or rash.  Neurological:     General: No focal deficit present.     Mental Status: He is alert. Mental status is at baseline.     ED Results / Procedures / Treatments   Labs (all labs ordered are listed, but only abnormal results are displayed) Labs Reviewed - No data to display  EKG None  Radiology DG Finger Thumb Right  Result Date: 08/14/2023 CLINICAL DATA:  Right thumb pain, swelling.  No known injury. EXAM: RIGHT THUMB 2+V COMPARISON:  None Available. FINDINGS: There is no evidence of fracture or dislocation. There is no evidence of arthropathy or other focal bone abnormality. Soft tissues are unremarkable. No radiopaque foreign body. IMPRESSION: Negative. Electronically Signed    By: Charlett Nose M.D.   On: 08/14/2023 03:02    Procedures .Marland KitchenIncision and Drainage  Date/Time: 08/14/2023 5:14 AM  Performed by: Ernie Avena, MD Authorized by: Ernie Avena, MD   Consent:    Consent obtained:  Verbal   Consent given by:  Patient   Risks discussed:  Bleeding and infection Universal protocol:    Patient identity confirmed:  Verbally with patient Location:    Type:  Abscess (Felon)   Location:  Upper extremity   Upper extremity location:  Finger   Finger location:  R thumb Pre-procedure details:    Skin preparation:  Povidone-iodine Sedation:    Sedation type:  None Anesthesia:    Anesthesia method:  Nerve block   Block needle gauge:  25 G   Block anesthetic:  Lidocaine 1% w/o epi   Block technique:  Ring   Block injection procedure:  Anatomic landmarks identified, introduced needle, incremental injection, anatomic landmarks palpated and negative aspiration for blood   Block outcome:  Anesthesia achieved Procedure type:    Complexity:  Simple Procedure details:    Ultrasound guidance: no     Incision types:  Stab incision   Drainage:  Bloody   Drainage amount:  Scant   Wound treatment:  Wound left open   Packing materials:  1/4 in iodoform gauze Post-procedure details:    Procedure completion:  Tolerated .Nerve Block  Date/Time: 08/14/2023 5:14 AM  Performed by: Ernie Avena, MD Authorized by: Ernie Avena, MD   Consent:    Consent obtained:  Verbal   Consent given by:  Patient   Risks discussed:  Infection and bleeding Universal protocol:    Patient identity confirmed:  Verbally with patient Indications:    Indications:  Procedural anesthesia Location:    Body area:  Upper extremity   Upper extremity nerve:  Metacarpal   Laterality:  Right Pre-procedure details:    Skin preparation:  Povidone-iodine   Preparation: Patient was prepped and draped in usual sterile fashion   Procedure details:    Block needle gauge:  25 G    Anesthetic injected:  Lidocaine 1% w/o epi   Injection procedure:  Anatomic landmarks identified, incremental injection, negative aspiration for blood, anatomic landmarks palpated and introduced needle Post-procedure details:    Outcome:  Anesthesia achieved   Procedure completion:  Tolerated     Medications Ordered in ED Medications  doxycycline (VIBRA-TABS) tablet 100 mg (has no administration in time range)  lidocaine (PF) (XYLOCAINE) 1 % injection 30 mL (5 mLs Infiltration Given 08/14/23 0454)  HYDROcodone-acetaminophen (NORCO/VICODIN) 5-325 MG per tablet 1 tablet (1 tablet Oral Given  08/14/23 0454)    ED Course/ Medical Decision Making/ A&P                                 Medical Decision Making Amount and/or Complexity of Data Reviewed Radiology: ordered.  Risk Prescription drug management.     57 year old male presenting to the emergency department with thumb pain on the right.  He endorses distal pain and swelling for the last 3 days.  He is able to flex and extend the thumb, denies any fevers or chills.  No known injury to the thumb that he can think of.  Arrival, the patient was vitally stable.  Physical exam as per above concerning for a felon of the distal right thumb, low concern for flexor tenosynovitis, only 1 of 4 [place] signs present.  X-ray imaging of the right thumb was ultimately negative.  Patient's tetanus is up-to-date.  Consented for incision and drainage of the patient's thumb which was performed as per the procedure note above.  Nerve block of the thumb was performed with adequate anesthesia.  I&D of the patient's thumb was performed with scant drainage.  Will place the patient on doxycycline, low concern for flexor tenosynovitis at this time, return precautions provided in the event of worsening signs and symptoms of flexor tenosynovitis.   Final Clinical Impression(s) / ED Diagnoses Final diagnoses:  Felon of finger    Rx / DC Orders ED Discharge  Orders          Ordered    doxycycline (VIBRAMYCIN) 100 MG capsule  2 times daily        08/14/23 0513              Ernie Avena, MD 08/14/23 [phone]

## 2023-08-14 NOTE — ED Triage Notes (Signed)
Pt reporting right thumb pain (distal) and swelling for about 3 days, now extends through the thumb denies specific injury.

## 2023-08-24 ENCOUNTER — Other Ambulatory Visit (HOSPITAL_BASED_OUTPATIENT_CLINIC_OR_DEPARTMENT_OTHER): Payer: Self-pay

## 2023-08-24 ENCOUNTER — Other Ambulatory Visit (HOSPITAL_COMMUNITY): Payer: Self-pay

## 2024-09-09 ENCOUNTER — Emergency Department (HOSPITAL_COMMUNITY): Payer: Self-pay

## 2024-09-09 ENCOUNTER — Observation Stay (HOSPITAL_COMMUNITY)
Admission: EM | Admit: 2024-09-09 | Discharge: 2024-09-10 | Disposition: A | Payer: Self-pay | Attending: Infectious Diseases | Admitting: Infectious Diseases

## 2024-09-09 ENCOUNTER — Other Ambulatory Visit: Payer: Self-pay

## 2024-09-09 ENCOUNTER — Encounter (HOSPITAL_COMMUNITY): Payer: Self-pay | Admitting: Infectious Diseases

## 2024-09-09 DIAGNOSIS — F1490 Cocaine use, unspecified, uncomplicated: Secondary | ICD-10-CM

## 2024-09-09 DIAGNOSIS — Z792 Long term (current) use of antibiotics: Secondary | ICD-10-CM

## 2024-09-09 DIAGNOSIS — N2889 Other specified disorders of kidney and ureter: Secondary | ICD-10-CM

## 2024-09-09 DIAGNOSIS — J188 Other pneumonia, unspecified organism: Secondary | ICD-10-CM

## 2024-09-09 DIAGNOSIS — E785 Hyperlipidemia, unspecified: Secondary | ICD-10-CM | POA: Insufficient documentation

## 2024-09-09 DIAGNOSIS — I700 Atherosclerosis of aorta: Secondary | ICD-10-CM | POA: Insufficient documentation

## 2024-09-09 DIAGNOSIS — D72829 Elevated white blood cell count, unspecified: Secondary | ICD-10-CM | POA: Insufficient documentation

## 2024-09-09 DIAGNOSIS — I10 Essential (primary) hypertension: Secondary | ICD-10-CM | POA: Diagnosis present

## 2024-09-09 DIAGNOSIS — A419 Sepsis, unspecified organism: Principal | ICD-10-CM

## 2024-09-09 DIAGNOSIS — J189 Pneumonia, unspecified organism: Secondary | ICD-10-CM | POA: Diagnosis present

## 2024-09-09 DIAGNOSIS — Z85528 Personal history of other malignant neoplasm of kidney: Secondary | ICD-10-CM

## 2024-09-09 DIAGNOSIS — F1410 Cocaine abuse, uncomplicated: Secondary | ICD-10-CM | POA: Diagnosis present

## 2024-09-09 LAB — RESP PANEL BY RT-PCR (RSV, FLU A&B, COVID)  RVPGX2
Influenza A by PCR: NEGATIVE
Influenza B by PCR: NEGATIVE
Resp Syncytial Virus by PCR: NEGATIVE
SARS Coronavirus 2 by RT PCR: NEGATIVE

## 2024-09-09 LAB — BASIC METABOLIC PANEL WITH GFR
Anion gap: 10 (ref 5–15)
BUN: 18 mg/dL (ref 6–20)
CO2: 25 mmol/L (ref 22–32)
Calcium: 8.4 mg/dL — ABNORMAL LOW (ref 8.9–10.3)
Chloride: 101 mmol/L (ref 98–111)
Creatinine, Ser: 1.01 mg/dL (ref 0.61–1.24)
GFR, Estimated: >60 mL/min (ref >60)
Glucose, Bld: 96 mg/dL (ref 70–99)
Potassium: 3.7 mmol/L (ref 3.5–5.1)
Sodium: 136 mmol/L (ref 135–145)

## 2024-09-09 LAB — CBC
HCT: 42.6 % (ref 39.0–52.0)
Hemoglobin: 14.2 g/dL (ref 13.0–17.0)
MCH: 27.5 pg (ref 26.0–34.0)
MCHC: 33.3 g/dL (ref 30.0–36.0)
MCV: 82.6 fL (ref 80.0–100.0)
Platelets: 275 K/uL (ref 150–400)
RBC: 5.16 MIL/uL (ref 4.22–5.81)
RDW: 13.4 % (ref 11.5–15.5)
WBC: 18.1 K/uL — ABNORMAL HIGH (ref 4.0–10.5)
nRBC: 0 % (ref 0.0–0.2)

## 2024-09-09 LAB — HEPATITIS PANEL, ACUTE
HCV Ab: NONREACTIVE
Hep A IgM: NONREACTIVE
Hep B C IgM: NONREACTIVE
Hepatitis B Surface Ag: NONREACTIVE

## 2024-09-09 LAB — LIPASE, BLOOD: Lipase: 75 U/L — ABNORMAL HIGH (ref 11–51)

## 2024-09-09 LAB — HEPATIC FUNCTION PANEL
ALT: 15 U/L (ref 0–44)
AST: 26 U/L (ref 15–41)
Albumin: 3.1 g/dL — ABNORMAL LOW (ref 3.5–5.0)
Alkaline Phosphatase: 89 U/L (ref 38–126)
Bilirubin, Direct: <0.1 mg/dL (ref 0.0–0.2)
Total Bilirubin: 0.2 mg/dL (ref 0.0–1.2)
Total Protein: 6.6 g/dL (ref 6.5–8.1)

## 2024-09-09 LAB — LIPID PANEL
Cholesterol: 166 mg/dL (ref 0–200)
HDL: 50 mg/dL (ref >40)
LDL Cholesterol: 90 mg/dL (ref 0–99)
Total CHOL/HDL Ratio: 3.3 ratio
Triglycerides: 129 mg/dL (ref <150)
VLDL: 26 mg/dL (ref 0–40)

## 2024-09-09 LAB — URINALYSIS, W/ REFLEX TO CULTURE (INFECTION SUSPECTED)
Bilirubin Urine: NEGATIVE
Glucose, UA: 100 mg/dL — AB
Ketones, ur: NEGATIVE mg/dL
Leukocytes,Ua: NEGATIVE
Nitrite: NEGATIVE
Protein, ur: 30 mg/dL — AB
Specific Gravity, Urine: 1.015 (ref 1.005–1.030)
pH: 5.5 (ref 5.0–8.0)

## 2024-09-09 LAB — MRSA NEXT GEN BY PCR, NASAL: MRSA by PCR Next Gen: NOT DETECTED

## 2024-09-09 LAB — STREP PNEUMONIAE URINARY ANTIGEN: Strep Pneumo Urinary Antigen: NEGATIVE

## 2024-09-09 LAB — PROTIME-INR
INR: 1.1 (ref 0.8–1.2)
Prothrombin Time: 14.7 s (ref 11.4–15.2)

## 2024-09-09 LAB — HIV ANTIBODY (ROUTINE TESTING W REFLEX): HIV Screen 4th Generation wRfx: NONREACTIVE

## 2024-09-09 LAB — I-STAT CG4 LACTIC ACID, ED: Lactic Acid, Venous: 1.1 mmol/L (ref 0.5–1.9)

## 2024-09-09 MED ORDER — ACETAMINOPHEN 650 MG RE SUPP: 650.0000 mg | Freq: Four times a day (QID) | RECTAL | Status: DC | PRN

## 2024-09-09 MED ORDER — SODIUM CHLORIDE 0.9 % IV SOLN
2.0000 g | Freq: Once | INTRAVENOUS | Status: AC
  Administered 2024-09-09: 09:00:00 2 g via INTRAVENOUS
  Filled 2024-09-09: qty 12.5

## 2024-09-09 MED ORDER — POLYETHYLENE GLYCOL 3350 17 G PO PACK: 17.0000 g | PACK | Freq: Every day | ORAL | Status: DC | PRN

## 2024-09-09 MED ORDER — AZITHROMYCIN 500 MG PO TABS
500.0000 mg | ORAL_TABLET | Freq: Every day | ORAL | Status: DC
  Administered 2024-09-09 – 2024-09-10 (×2): 500 mg via ORAL
  Filled 2024-09-09: qty 1
  Filled 2024-09-09: qty 2

## 2024-09-09 MED ORDER — NICOTINE 14 MG/24HR TD PT24
14.0000 mg | MEDICATED_PATCH | Freq: Every day | TRANSDERMAL | Status: DC
  Administered 2024-09-09 – 2024-09-10 (×2): 14 mg via TRANSDERMAL
  Filled 2024-09-09 (×2): qty 1

## 2024-09-09 MED ORDER — VANCOMYCIN HCL IN DEXTROSE 1-5 GM/200ML-% IV SOLN
1000.0000 mg | Freq: Once | INTRAVENOUS | Status: AC
  Administered 2024-09-09: 11:00:00 1000 mg via INTRAVENOUS
  Filled 2024-09-09: qty 200

## 2024-09-09 MED ORDER — LACTATED RINGERS IV SOLN: INTRAVENOUS | Status: AC

## 2024-09-09 MED ORDER — METRONIDAZOLE 500 MG/100ML IV SOLN
500.0000 mg | Freq: Once | INTRAVENOUS | Status: AC
  Administered 2024-09-09: 10:00:00 500 mg via INTRAVENOUS
  Filled 2024-09-09: qty 100

## 2024-09-09 MED ORDER — SODIUM CHLORIDE 0.9 % IV SOLN: 2.0000 g | INTRAVENOUS | Status: DC

## 2024-09-09 MED ORDER — ENOXAPARIN SODIUM 40 MG/0.4ML IJ SOSY
40.0000 mg | PREFILLED_SYRINGE | INTRAMUSCULAR | Status: DC
  Administered 2024-09-10: 10:00:00 40 mg via SUBCUTANEOUS
  Filled 2024-09-09 (×2): qty 0.4

## 2024-09-09 MED ORDER — LACTATED RINGERS IV BOLUS (SEPSIS)
250.0000 mL | Freq: Once | INTRAVENOUS | Status: AC
  Administered 2024-09-09: 10:00:00 250 mL via INTRAVENOUS

## 2024-09-09 MED ORDER — VANCOMYCIN HCL IN DEXTROSE 1-5 GM/200ML-% IV SOLN: 1000.0000 mg | Freq: Two times a day (BID) | INTRAVENOUS | Status: DC

## 2024-09-09 MED ORDER — IOHEXOL 350 MG/ML SOLN
75.0000 mL | Freq: Once | INTRAVENOUS | Status: AC | PRN
  Administered 2024-09-09: 09:00:00 75 mL via INTRAVENOUS

## 2024-09-09 MED ORDER — ACETAMINOPHEN 325 MG PO TABS: 650.0000 mg | ORAL_TABLET | Freq: Four times a day (QID) | ORAL | Status: DC | PRN

## 2024-09-09 MED ORDER — LORAZEPAM 2 MG/ML IJ SOLN
0.5000 mg | Freq: Once | INTRAMUSCULAR | Status: AC
  Administered 2024-09-09: 22:00:00 1 mg via INTRAVENOUS
  Filled 2024-09-09 (×2): qty 1

## 2024-09-09 MED ORDER — SODIUM CHLORIDE 0.9 % IV SOLN
3.0000 g | Freq: Four times a day (QID) | INTRAVENOUS | Status: DC
  Administered 2024-09-09 – 2024-09-10 (×3): 3 g via INTRAVENOUS
  Filled 2024-09-09 (×3): qty 8

## 2024-09-09 MED ORDER — TAMSULOSIN HCL 0.4 MG PO CAPS
0.4000 mg | ORAL_CAPSULE | Freq: Every day | ORAL | Status: DC
  Administered 2024-09-09 – 2024-09-10 (×2): 0.4 mg via ORAL
  Filled 2024-09-09 (×2): qty 1

## 2024-09-09 MED ORDER — VANCOMYCIN HCL 500 MG/100ML IV SOLN
500.0000 mg | Freq: Once | INTRAVENOUS | Status: AC
  Administered 2024-09-09: 12:00:00 500 mg via INTRAVENOUS
  Filled 2024-09-09: qty 100

## 2024-09-09 MED ORDER — LACTATED RINGERS IV BOLUS (SEPSIS)
1000.0000 mL | Freq: Once | INTRAVENOUS | Status: AC
  Administered 2024-09-09: 10:00:00 1000 mL via INTRAVENOUS

## 2024-09-09 MED ORDER — LIDOCAINE 5 % EX PTCH
1.0000 | MEDICATED_PATCH | CUTANEOUS | Status: DC
  Administered 2024-09-09 – 2024-09-10 (×2): 1 via TRANSDERMAL
  Filled 2024-09-09 (×2): qty 1

## 2024-09-09 MED ORDER — ATORVASTATIN CALCIUM 10 MG PO TABS
20.0000 mg | ORAL_TABLET | Freq: Every day | ORAL | Status: DC
  Administered 2024-09-09 – 2024-09-10 (×2): 20 mg via ORAL
  Filled 2024-09-09: qty 2
  Filled 2024-09-09: qty 1

## 2024-09-09 MED ORDER — PANTOPRAZOLE SODIUM 40 MG PO TBEC
40.0000 mg | DELAYED_RELEASE_TABLET | Freq: Every day | ORAL | Status: DC
  Administered 2024-09-09 – 2024-09-10 (×2): 40 mg via ORAL
  Filled 2024-09-09 (×2): qty 1

## 2024-09-09 NOTE — Sepsis Progress Note (Signed)
 Sepsis protocol monitored by e[employer]

## 2024-09-09 NOTE — Care Management (Signed)
 Sent to [facility] to [facility] for [facility]

## 2024-09-09 NOTE — ED Notes (Signed)
 Patient transported to CT

## 2024-09-09 NOTE — ED Provider Notes (Signed)
 [facility] AT [facility] Provider Note   CSN: [id number] Arrival date & time: 09/09/24  9782     Patient presents with: Shortness of Breath   Marvin Mckee is a 58 y.o. male.   The history is provided by the patient and medical records. No language interpreter was used.  Shortness of Breath Severity:  Severe Onset quality:  Gradual Duration:  2 days Timing:  Constant Chronicity:  New Context: URI   Relieved by:  Nothing Worsened by:  Deep breathing and coughing Ineffective treatments:  None tried Associated symptoms: abdominal pain, chest pain, cough, fever, sputum production and vomiting   Associated symptoms: no diaphoresis, no headaches, no neck pain, no rash and no wheezing   Risk factors: hx of cancer   Risk factors: no hx of PE/DVT        Prior to Admission medications  Medication Sig Start Date End Date Taking? Authorizing Provider  atorvastatin  (LIPITOR) 20 MG tablet Take 1 tablet (20 mg total) by mouth daily. 08/14/22   Newlin, Enobong, MD  benzonatate  (TESSALON ) 200 MG capsule Take 1 capsule (200 mg total) by mouth 2 (two) times daily as needed for cough. 01/24/22   Ghimire, Donalda HERO, MD  cefdinir  (OMNICEF ) 300 MG capsule Take 1 capsule (300 mg total) by mouth 2 (two) times daily. 01/24/22   Ghimire, Donalda HERO, MD  multivitamin (ONE-A-DAY MEN'S) TABS tablet Take 1 tablet by mouth daily.    [provider]  naproxen sodium (ALEVE) 220 MG tablet Take 440 mg by mouth daily as needed (pain/headache).    [provider]  pantoprazole  (PROTONIX ) 40 MG tablet Take 1 tablet (40 mg total) by mouth daily. 01/24/22   Ghimire, Donalda HERO, MD  tadalafil  (CIALIS ) 10 MG tablet Take 1 tablet (10 mg total) by mouth every other day as needed for erectile dysfunction. 08/14/22   Newlin, Enobong, MD  tamsulosin  (FLOMAX ) 0.4 MG CAPS capsule Take 1 capsule (0.4 mg total) by mouth daily. 01/24/22   Ghimire, Donalda HERO, MD  vitamin C  (ASCORBIC ACID) 500 MG tablet Take 500 mg by mouth daily.    [provider]    Allergies: Patient has no known allergies.    Review of Systems  Constitutional:  Positive for chills, fatigue and fever. Negative for diaphoresis.  HENT:  Positive for congestion.   Respiratory:  Positive for cough, sputum production, chest tightness and shortness of breath. Negative for wheezing.   Cardiovascular:  Positive for chest pain. Negative for palpitations and leg swelling.  Gastrointestinal:  Positive for abdominal pain, diarrhea, nausea and vomiting. Negative for constipation.  Genitourinary:  Negative for dysuria and flank pain.  Musculoskeletal:  Negative for back pain, neck pain and neck stiffness.  Skin:  Negative for rash.  Neurological:  Negative for light-headedness and headaches.  Psychiatric/Behavioral:  Negative for agitation.   All other systems reviewed and are negative.   Updated Vital Signs BP (!) 144/106 (BP Location: Right Arm)   Pulse (!) 111   Temp 98.4 F (36.9 C) (Oral)   Resp 18   Ht 6' 1 (1.854 m)   Wt 71.2 kg   SpO2 95%   BMI 20.71 kg/m   Physical Exam Vitals and nursing note reviewed.  Constitutional:      General: He is not in acute distress.    Appearance: He is well-developed. He is not ill-appearing, toxic-appearing or diaphoretic.  HENT:     Head: Normocephalic and atraumatic.  Mouth/Throat:     Pharynx: No pharyngeal swelling or oropharyngeal exudate.  Eyes:     Extraocular Movements: Extraocular movements intact.     Conjunctiva/sclera: Conjunctivae normal.     Pupils: Pupils are equal, round, and reactive to light.  Cardiovascular:     Rate and Rhythm: Regular rhythm. Tachycardia present.     Heart sounds: No murmur heard. Pulmonary:     Effort: Pulmonary effort is normal. Tachypnea present. No respiratory distress.     Breath sounds: Rhonchi present. No rales.  Chest:     Chest wall: Tenderness present.  Abdominal:      Palpations: Abdomen is soft.     Tenderness: There is no abdominal tenderness.  Musculoskeletal:        General: No swelling.     Cervical back: Neck supple.     Right lower leg: No tenderness. No edema.     Left lower leg: No tenderness. No edema.  Skin:    General: Skin is warm and dry.     Capillary Refill: Capillary refill takes less than 2 seconds.     Findings: No erythema.  Neurological:     Mental Status: He is alert.  Psychiatric:        Mood and Affect: Mood normal.     (all labs ordered are listed, but only abnormal results are displayed) Labs Reviewed  BASIC METABOLIC PANEL WITH GFR - Abnormal; Notable for the following components:      Result Value   Calcium  8.4 (*)    All other components within normal limits  CBC - Abnormal; Notable for the following components:   WBC 18.1 (*)    All other components within normal limits  RESP PANEL BY RT-PCR (RSV, FLU A&B, COVID)  RVPGX2  CULTURE, BLOOD (ROUTINE X 2)  CULTURE, BLOOD (ROUTINE X 2)  URINALYSIS, W/ REFLEX TO CULTURE (INFECTION SUSPECTED)  HEPATIC FUNCTION PANEL  LIPASE, BLOOD  PROTIME-INR  I-STAT CG4 LACTIC ACID, ED    EKG: EKG Interpretation Date/Time:  Tuesday September 09 2024 03:01:15 EST Ventricular Rate:  90 PR Interval:  140 QRS Duration:  92 QT Interval:  336 QTC Calculation: 411 R Axis:   77  Text Interpretation: Normal sinus rhythm Moderate voltage criteria for LVH, may be normal variant ( Sokolow-Lyon , Cornell product ) Anteroseptal infarct , age undetermined Abnormal ECG When compared with ECG of 03-Aug-2022 02:11, PREVIOUS ECG IS PRESENT when compared to prior, overall similar appearance No STEMI Confirmed by Ginger Barefoot ([id number]) on 09/09/2024 8:22:18 AM  Radiology: CT Angio Chest PE W and/or Wo Contrast Result Date: 09/09/2024 CLINICAL DATA:  Shortness of breath and productive cough 2 days with generalized abdominal pain and fever. History of left renal cell carcinoma post excision  2017. EXAM: CT ANGIOGRAPHY CHEST CT ABDOMEN AND PELVIS WITH CONTRAST TECHNIQUE: Multidetector CT imaging of the chest was performed using the standard protocol during bolus administration of intravenous contrast. Multiplanar CT image reconstructions and MIPs were obtained to evaluate the vascular anatomy. Multidetector CT imaging of the abdomen and pelvis was performed using the standard protocol during bolus administration of intravenous contrast. RADIATION DOSE REDUCTION: This exam was performed according to the departmental dose-optimization program which includes automated exposure control, adjustment of the mA and/or kV according to patient size and/or use of iterative reconstruction technique. CONTRAST:  75mL OMNIPAQUE  IOHEXOL  350 MG/ML SOLN COMPARISON:  Chest CT 07/06/2023, abdominopelvic CT 11/01/2022 and 08/03/2022 FINDINGS: CTA CHEST FINDINGS Cardiovascular: Heart is normal size. Thoracic aorta is  normal in caliber without aneurysm or dissection. 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 Pulmonary arterial system is well opacified without evidence of emboli. Remaining vascular structures are unremarkable. Mediastinum/Nodes: No significant mediastinal or hilar adenopathy. Remaining mediastinal structures are unremarkable. Lungs/Pleura: Lungs are adequately inflated. There is patchy bilateral airspace opacification most notable over the lower lobes and right middle lobe compatible multifocal pneumonia. No effusion. Airways are notable for minimal dependent debris within the trachea and left mainstem bronchus likely aspirate material as patient's multifocal infection may be due to aspiration pneumonia. Musculoskeletal: No focal abnormality. Review of the MIP images confirms the above findings. CT ABDOMEN and PELVIS FINDINGS Hepatobiliary: Liver, gallbladder and biliary tree are normal. Pancreas: Normal. Spleen: Normal. Adrenals/Urinary Tract: Adrenal glands are  normal. Kidneys are normal in size without hydronephrosis or nephrolithiasis. Tiny subcentimeter hypodensity over the anterior cortex lower pole right kidney unchanged and too small to characterize, but likely cyst. Interval enlargement of a hypodense mass over the lower pole right kidney measuring 2.1 cm (previously 1.3 cm). Ureters and bladder are unremarkable. Stomach/Bowel: Stomach and small bowel are unremarkable. Appendix is not visualized. Colon is unremarkable. Vascular/Lymphatic: Minimal calcified plaque over the abdominal aorta which is normal in caliber. Remaining vascular structures are unremarkable. No adenopathy. Reproductive: Prostate is unremarkable. Other: No free fluid or free peritoneal air. No focal inflammatory process. Musculoskeletal: Mild spondylosis of the lower lumbar spine with degenerative disc disease at the L5-S1 level. Review of the MIP images confirms the above findings. IMPRESSION: 1. No evidence of pulmonary embolism. 2. Patchy bilateral airspace opacification most notable over the lower lobes and right middle lobe compatible with multifocal pneumonia. Minimal dependent debris within the trachea and left mainstem bronchus as patient's multifocal infection may be due to aspiration pneumonia. 3. No acute findings in the abdomen/pelvis. 4. Interval enlargement of a hypodense mass over the lower pole right kidney measuring 2.1 cm (previously 1.3 cm). This is indeterminate, but concerning for renal cell carcinoma. Recommend further evaluation with MRI of the abdomen with and without contrast. 5. Aortic atherosclerosis. Aortic Atherosclerosis (ICD10-I70.0). Electronically Signed   By: Toribio Agreste M.D.   On: 09/09/2024 09:01   CT ABDOMEN PELVIS W CONTRAST Result Date: 09/09/2024 CLINICAL DATA:  Shortness of breath and productive cough 2 days with generalized abdominal pain and fever. History of left renal cell carcinoma post excision 2017. EXAM: CT ANGIOGRAPHY CHEST CT ABDOMEN AND  PELVIS WITH CONTRAST TECHNIQUE: Multidetector CT imaging of the chest was performed using the standard protocol during bolus administration of intravenous contrast. Multiplanar CT image reconstructions and MIPs were obtained to evaluate the vascular anatomy. Multidetector CT imaging of the abdomen and pelvis was performed using the standard protocol during bolus administration of intravenous contrast. RADIATION DOSE REDUCTION: This exam was performed according to the departmental dose-optimization program which includes automated exposure control, adjustment of the mA and/or kV according to patient size and/or use of iterative reconstruction technique. CONTRAST:  75mL OMNIPAQUE  IOHEXOL  350 MG/ML SOLN COMPARISON:  Chest CT 07/06/2023, abdominopelvic CT 11/01/2022 and 08/03/2022 FINDINGS: CTA CHEST FINDINGS Cardiovascular: Heart is normal size. Thoracic aorta is normal in caliber without aneurysm or dissection. 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 Pulmonary arterial system is well opacified without evidence of emboli. Remaining vascular structures are unremarkable. Mediastinum/Nodes: No significant mediastinal or hilar adenopathy. Remaining mediastinal structures are unremarkable. Lungs/Pleura: Lungs are adequately inflated. There is patchy bilateral airspace opacification most notable over the  lower lobes and right middle lobe compatible multifocal pneumonia. No effusion. Airways are notable for minimal dependent debris within the trachea and left mainstem bronchus likely aspirate material as patient's multifocal infection may be due to aspiration pneumonia. Musculoskeletal: No focal abnormality. Review of the MIP images confirms the above findings. CT ABDOMEN and PELVIS FINDINGS Hepatobiliary: Liver, gallbladder and biliary tree are normal. Pancreas: Normal. Spleen: Normal. Adrenals/Urinary Tract: Adrenal glands are normal. Kidneys are normal in size without  hydronephrosis or nephrolithiasis. Tiny subcentimeter hypodensity over the anterior cortex lower pole right kidney unchanged and too small to characterize, but likely cyst. Interval enlargement of a hypodense mass over the lower pole right kidney measuring 2.1 cm (previously 1.3 cm). Ureters and bladder are unremarkable. Stomach/Bowel: Stomach and small bowel are unremarkable. Appendix is not visualized. Colon is unremarkable. Vascular/Lymphatic: Minimal calcified plaque over the abdominal aorta which is normal in caliber. Remaining vascular structures are unremarkable. No adenopathy. Reproductive: Prostate is unremarkable. Other: No free fluid or free peritoneal air. No focal inflammatory process. Musculoskeletal: Mild spondylosis of the lower lumbar spine with degenerative disc disease at the L5-S1 level. Review of the MIP images confirms the above findings. IMPRESSION: 1. No evidence of pulmonary embolism. 2. Patchy bilateral airspace opacification most notable over the lower lobes and right middle lobe compatible with multifocal pneumonia. Minimal dependent debris within the trachea and left mainstem bronchus as patient's multifocal infection may be due to aspiration pneumonia. 3. No acute findings in the abdomen/pelvis. 4. Interval enlargement of a hypodense mass over the lower pole right kidney measuring 2.1 cm (previously 1.3 cm). This is indeterminate, but concerning for renal cell carcinoma. Recommend further evaluation with MRI of the abdomen with and without contrast. 5. Aortic atherosclerosis. Aortic Atherosclerosis (ICD10-I70.0). Electronically Signed   By: Toribio Agreste M.D.   On: 09/09/2024 09:01   DG Chest 2 View Result Date: 09/09/2024 EXAM: 2 VIEW(S) XRAY OF THE CHEST 09/09/2024 03:13:00 AM COMPARISON: 08/03/2022 (plain film), CT 07/06/2023. CLINICAL HISTORY: shob FINDINGS: LUNGS AND PLEURA: Bibasilar airspace opacities are noted improved from prior plain film in 2023, but increased from prior  CT examination in 2024. No pleural effusion. No pneumothorax. HEART AND MEDIASTINUM: No acute abnormality of the cardiac and mediastinal silhouettes. BONES AND SOFT TISSUES: No acute osseous abnormality. IMPRESSION: 1. Bibasilar airspace opacities, improved from prior plain film in 2023, but increased from prior CT examination in 2024. Electronically signed by: Oneil Devonshire MD 09/09/2024 03:21 AM EST RP Workstation: [id number]     Procedures    CRITICAL CARE Performed by: Lonni PARAS Robben Jagiello Total critical care time: 30 minutes Critical care time was exclusive of separately billable procedures and treating other patients. Critical care was necessary to treat or prevent imminent or life-threatening deterioration. Critical care was time spent personally by me on the following activities: development of treatment plan with patient and/or surrogate as well as nursing, discussions with consultants, evaluation of patient's response to treatment, examination of patient, obtaining history from patient or surrogate, ordering and performing treatments and interventions, ordering and review of laboratory studies, ordering and review of radiographic studies, pulse oximetry and re-evaluation of patient's condition.  Medications Ordered in the ED  lactated ringers  infusion (has no administration in time range)  lactated ringers  bolus 1,000 mL (has no administration in time range)    And  lactated ringers  bolus 1,000 mL (has no administration in time range)    And  lactated ringers  bolus 250 mL (has no administration in time range)  metroNIDAZOLE  (FLAGYL ) IVPB 500 mg (has no administration in time range)  vancomycin  (VANCOCIN ) IVPB 1000 mg/200 mL premix (has no administration in time range)  ceFEPIme  (MAXIPIME ) 2 g in sodium chloride  0.9 % 100 mL IVPB (2 g Intravenous New Bag/Given 09/09/24 0904)  iohexol  (OMNIPAQUE ) 350 MG/ML injection 75 mL (75 mLs Intravenous Contrast Given 09/09/24 0834)                                     Medical Decision Making Amount and/or Complexity of Data Reviewed Labs: ordered. Radiology: ordered.  Risk Prescription drug management. Decision regarding hospitalization.    Marvin Mckee is a 58 y.o. male with a past medical history significant for hypertension, renal cell cancer status post kidney surgery and previous substance abuse who presents with shortness of breath, productive cough, fevers, chills, chest pain and abdominal pain.  According to patient, for the last 2 days he has had worsening shortness of breath and cough.  He is coughing up some phlegm.  He is also having nausea and vomiting and has abdominal pain.  He reports it does hurt to take a deep breath and he was worried about blood clots.  He denies any trauma.  He denies any pain in arms and legs.  Does not report sick contacts.  On my initial evaluation, patient feels very warm.  I did an oral temperature and it was found to be 100.4.  With his tachycardia and tachypnea he is currently experiencing, will activate a code sepsis due to vital signs.  On exam, he does have coarseness on his breath sounds bilaterally and his chest is tender.  Abdomen is also extremely tender.  Patient had some labs starting in triage that does show leukocytosis of 18.1.  His BMP did not show critical abnormality but did show some hypocalcemia.  Will add on hepatic function given his abdominal tenderness as well as a lipase.  Will add on a CT PE study and a CT abdomen pelvis with contrast to look for infection in his abdomen but also to rule out pulmonary embolism.  His creatinine was normal so we will use the contrast.  His chest x-ray does show opacities and did not show pneumothorax but CT will further clarify if it appears like multifocal pneumonia or these are changes from a pulm embolism.  Due to his appearance vital signs and workup thus far with sepsis, anticipate he will likely need admission for further  management.   9:28 AM CT returned and does not show pulmonary embolism but does show multifocal pneumonia and abnormality to the kidney.  Will call for admission for further workup and management.      Final diagnoses:  Sepsis due to pneumonia [facility])  Multifocal pneumonia    Clinical Impression: 1. Sepsis due to pneumonia (HCC)   2. Multifocal pneumonia     Disposition: Admit  This note was prepared with assistance of Dragon voice recognition software. Occasional wrong-word or sound-a-like substitutions may have occurred due to the inherent limitations of voice recognition software.       Gervis Gaba, Lonni PARAS, MD 09/09/24 [phone]

## 2024-09-09 NOTE — ED Notes (Signed)
 Patient refused second IV. MD Tegeler notified.

## 2024-09-09 NOTE — ED Notes (Signed)
 Awaiting vancomycin  from main pharm

## 2024-09-09 NOTE — Hospital Course (Addendum)
 58 year old  Multifocal pna   Sepsis on White count   CT PE, negative for PE   He state sthat he can't breath. Shortnof breath  Arms sore Body sore 3 days ago Has not tried any medicine   Tried amoxicillin  (from before) that he tried   Shortness of breth got owrse  Cough congestion  Coughing up sputum  Glob of mucyus Green yellow gray  No red  Short of breath walking and at rest   Pneumina before    No travel  No car rides or plane   Has chest pain with worse with breathing  Can't take a whole rhbeath  Chest pain In hip.   Arms and legs he feels like he can't lift   No leg swelling   Lives

## 2024-09-09 NOTE — ED Notes (Signed)
 Pt is currently eating lunch.  Per MRI, he must be NPO for 4-6hrs prior to his scan.

## 2024-09-09 NOTE — Progress Notes (Signed)
 Pharmacy Antibiotic Note  Marvin Mckee is a 58 y.o. male admitted on 09/09/2024 with pneumonia.  Pharmacy has been consulted for vancomycin  / unasyn  dosing.  Plan: Vancomycin  1000 q12h AUC 482  Unasyn  3 g q6h  Will follow for culture results, levels, length of therapy as necessary   Height: 6' 1 (185.4 cm) Weight: 71.2 kg (157 lb) IBW/kg (Calculated) : 79.9  Temp (24hrs), Avg:99 F (37.2 C), Min:98.2 F (36.8 C), Max:100.4 F (38 C)  Recent Labs  Lab 09/09/24 0250 09/09/24 0839  WBC 18.1*  --   CREATININE 1.01  --   LATICACIDVEN  --  1.1    Estimated Creatinine Clearance: 82.2 mL/min (by C-G formula based on SCr of 1.01 mg/dL).    Allergies[1]  Thank you for allowing pharmacy to be a part of this patients care.  Rankin Sams 09/09/2024 11:46 AM     [1] No Known Allergies

## 2024-09-09 NOTE — H&P (Addendum)
 Date: 09/09/2024               Patient Name:  Marvin Mckee MRN: [id number]  DOB: [birth year] Age / Sex: 58 y.o., male   PCP: Delbert Clam, MD         Medical Service: Internal Medicine Teaching Service         Attending Physician: Dr. Eben Reyes BROCKS, MD      First Contact: Dr. Rebecka Pion, DO     Second Contact: Dr. Toma Edwards, DO         After Hours (After 5p/  First Contact Pager: [phone]  weekends / holidays): Second Contact Pager: [phone]   SUBJECTIVE   Chief Complaint: Shortness of breath   History of Present Illness:   This is a 58 year old male with past medical history of renal cell carcinoma in remission (status post left partial nephrectomy at [facility]), hypertension, BPH, history of narcolepsy who presents to the emergency department with 3-day history of cough, congestion, fevers.  Patient also reports having myalgias and arthralgias. He has had no sick contacts.  He denies any travels.  He states he has been spitting up green/yellow/gray mucus over the past 3 days.  His shortness of breath has become worse and it is at both rest and exertion.  He did has developed chest pain with coughing and with inspiration that he describes to be sharp in nature.  He has had an old prescription of amoxicillin  which he states he took but did not help with his symptoms.  With worsening symptoms, he decided to present to the emergency department.  He denies any leg swelling, bladder or bowel changes.  He does endorse that he was told that his cancer may be coming back and he needs to be seen by his urologist but was lost to follow up  ED Course: Patient initially presented to the emergency department afebrile but then became febrile to 100.4 F, pulse 100, blood pressure 138/107 satting at 95% on room air.  Labs showed increased creatinine to 1.01 from 0.89 count of 18.1, normal lactic acid.  CT showed evidence of multifocal pneumonia with concern for aspiration.  Patient  started on cefepime , Flagyl , vancomycin .  IMTS consulted for admission  Past Medical History Past Medical History:  Diagnosis Date   Back pain    Cancer (HCC)    Cocaine abuse (HCC)    DDD (degenerative disc disease), lumbar    Hypertension      Meds:  Active Medications[1] Lipitor 20 mg daily Benzonatate  200 mg twice daily as needed Multivitamin Naproxen 440 mg daily as needed Pantoprazole  40 mg daily Cialis  10 mg as needed Tamsulosin  0.4 mg daily Vitamin C 500 mg daily  Does not take any of his medications   Past Surgical History  Past Surgical History:  Procedure Laterality Date   KIDNEY SURGERY      Social:  Lives With: House in [place] alone Occupation: former psychologist, 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 Clam Delbert, MD  Substances: cigarettes 5 years, 1ppd, does not drink alcohol, uses coke (5 days ago)   Family History: No pertinent family history  Allergies: Allergies as of 09/09/2024   (No Known Allergies)    Review of Systems: A complete ROS was negative except as per HPI.   OBJECTIVE:   Physical Exam: Blood pressure (!) 144/106, pulse (!) 111, temperature (!) 100.4  F (38 C), temperature source Oral, resp. rate 18, height 6' 1 (1.854 m), weight 71.2 kg, SpO2 95%.  Constitutional: Anxious appearing, resting in bed on room air HENT: normocephalic atraumati Cardiovascular: Tachycardic rate, no murmurs, rubs, or gallops Pulmonary/Chest: Left lung field with coarse sounds appreciated, right lung fields clear Abdominal: soft, non-tender, non-distended Neurological: alert & oriented x 3, 5/5 strength in bilateral upper and lower extremities  Labs: CBC    Component Value Date/Time   WBC 18.1 (H) 09/09/2024 0250   RBC 5.16 09/09/2024 0250   HGB 14.2 09/09/2024 0250   HGB 12.8 (L) 02/08/2022 1159   HCT 42.6 09/09/2024 0250   HCT 39.2 02/08/2022 1159    PLT 275 09/09/2024 0250   PLT 405 02/08/2022 1159   MCV 82.6 09/09/2024 0250   MCV 83 02/08/2022 1159   MCH 27.5 09/09/2024 0250   MCHC 33.3 09/09/2024 0250   RDW 13.4 09/09/2024 0250   RDW 13.3 02/08/2022 1159   LYMPHSABS 2.4 08/03/2022 0220   LYMPHSABS 2.1 02/08/2022 1159   MONOABS 1.5 (H) 08/03/2022 0220   EOSABS 0.2 08/03/2022 0220   EOSABS 0.3 02/08/2022 1159   BASOSABS 0.1 08/03/2022 0220   BASOSABS 0.1 02/08/2022 1159     CMP     Component Value Date/Time   NA 136 09/09/2024 0250   NA 139 02/08/2022 1159   K 3.7 09/09/2024 0250   CL 101 09/09/2024 0250   CO2 25 09/09/2024 0250   GLUCOSE 96 09/09/2024 0250   BUN 18 09/09/2024 0250   BUN 11 02/08/2022 1159   CREATININE 1.01 09/09/2024 0250   CALCIUM  8.4 (L) 09/09/2024 0250   PROT 7.6 08/03/2022 0220   PROT 7.0 02/08/2022 1159   ALBUMIN 3.1 (L) 08/03/2022 0220   ALBUMIN 4.0 02/08/2022 1159   AST 30 08/03/2022 0220   ALT 34 08/03/2022 0220   ALKPHOS 72 08/03/2022 0220   BILITOT 0.6 08/03/2022 0220   BILITOT <0.2 02/08/2022 1159   GFRNONAA >60 09/09/2024 0250   GFRAA 78 11/19/2019 1646    Imaging:  Chest x-ray IMPRESSION: 1. Bibasilar airspace opacities, improved from prior plain film in 2023, but increased from prior CT examination in 2024.  CTA chest PE study IMPRESSION: 1. No evidence of pulmonary embolism. 2. Patchy bilateral airspace opacification most notable over the lower lobes and right middle lobe compatible with multifocal pneumonia. Minimal dependent debris within the trachea and left mainstem bronchus as patient's multifocal infection may be due to aspiration pneumonia. 3. No acute findings in the abdomen/pelvis. 4. Interval enlargement of a hypodense mass over the lower pole right kidney measuring 2.1 cm (previously 1.3 cm). This is indeterminate, but concerning for renal cell carcinoma. Recommend further evaluation with MRI of the abdomen with and without contrast. 5. Aortic  atherosclerosis.  CT abdomen pelvis with contrast IMPRESSION: 1. No evidence of pulmonary embolism. 2. Patchy bilateral airspace opacification most notable over the lower lobes and right middle lobe compatible with multifocal pneumonia. Minimal dependent debris within the trachea and left mainstem bronchus as patient's multifocal infection may be due to aspiration pneumonia. 3. No acute findings in the abdomen/pelvis. 4. Interval enlargement of a hypodense mass over the lower pole right kidney measuring 2.1 cm (previously 1.3 cm). This is indeterminate, but concerning for renal cell carcinoma. Recommend further evaluation with MRI of the abdomen with and without contrast. 5. Aortic atherosclerosis.  EKG: personally reviewed my interpretation is sinus tachycardia.  Some enlarged P waves suggestive of atrial enlargement.  Normal  axis  ASSESSMENT & PLAN:   Assessment & Plan by Problem: Principal Problem:   CAP (community acquired pneumonia) Active Problems:   HTN (hypertension)   Cocaine abuse (HCC)   History of renal cell cancer   Aortic atherosclerosis   Hyperlipidemia   Leukocytosis   Marvin Mckee is a 58 y.o. person living with a history of renal cell carcinoma status post left partial nephrectomy, hypertension, hyperlipidemia, cocaine use who presents with concern for shortness of breath found to have commune acquired pneumonia.  Patient admitted for further evaluation and management.  #Community-acquired pneumonia versus aspiration pneumonia #Leukocytosis Patient presents with concerns of shortness of breath, chest pain, cough, congestion.  Patient found to have multifocal pneumonia with concern for aspiration as well.  No PE.  Patient was given sepsis protocol in the emergency department with fluids and antibiotics.  He was given vancomycin , cefepime , and Flagyl .  At this time we will de-escalate down to Unasyn , azithromycin , and vancomycin .  Will follow-up MRSA nares  to see if we can discontinue vancomycin . Will follow blood cultures.  Fortunately patient is on room air already. - Continue Unasyn  - Continue vancomycin  - Continue azithromycin  - Follow-up MRSA nares, if negative, can discontinue vancomycin  - Follow blood culture -nFollow-up strep pneumo and Legionella - Monitor respiratory status - Continue fluids for 20 more hours  #History of renal cell carcinoma status post left partial nephrectomy #Right renal mass, imaging concerning for renal cell carcinoma Patient has a past medical history of renal cell carcinoma.  He was seen by his provider on 08/14/2022 and he was referred to urology, but patient has been lost to follow-up with his urologist.  CT findings today show evidence of potential interval enlargement of hypodense mass of the right kidney.  This is concerning for renal cell carcinoma. -Will have patient reach out to urology to be seen outpatient -Will obtain MRI abdomen with and without   #Hypertension Patient with past medical history of hypertension.  Not on any antihypertensive outpatient.  Blood pressure today 144/106.  Patient could benefit from antihypertensive outpatient. - Monitor blood pressure closely -nOnce out of acute illness phase, potentially can start ACE/ARB  #Aortic atherosclerosis Imaging finding evidence of aortic atherosclerosis.  Will obtain lipid panel.  Patient is to be on Lipitor at home, but he is not adherent. - Follow-up lipid panel - Start home Lipitor 20 mg daily  #Cocaine user #Tobacco use disorder Provided counseling for cessation - TOC consulted  - Nicotine  patch provided  #BPH -Resume home tamsulosin  0.5 mg daily  #GERD - Resume home Protonix  40 mg daily  Diet: Normal VTE: Enoxaparin  IVF: LR,150cc/hr Code: DNR  Prior to Admission Living Arrangement: Home, living alone Anticipated Discharge Location: Home Barriers to Discharge: Pending clinical improvement  Dispo: Admit patient to  Observation with expected length of stay less than 2 midnights.  Signed: Tobie Gaines, DO Internal Medicine Resident PGY-3  Please page on call intern or resident: First contact: [phone] If no answer in 15 minutes, please contact senior pager at [phone]     [1]  No outpatient medications have been marked as taking for the 09/09/24 encounter [facility] Encounter).

## 2024-09-09 NOTE — ED Triage Notes (Signed)
 Pt arrives POV with complaints of shob and productive cough that started 2 days ago.

## 2024-09-10 ENCOUNTER — Encounter (HOSPITAL_COMMUNITY): Payer: Self-pay | Admitting: Infectious Diseases

## 2024-09-10 ENCOUNTER — Observation Stay (HOSPITAL_COMMUNITY): Payer: Self-pay

## 2024-09-10 ENCOUNTER — Other Ambulatory Visit (HOSPITAL_COMMUNITY): Payer: Self-pay

## 2024-09-10 LAB — CBC WITH DIFFERENTIAL/PLATELET
Abs Immature Granulocytes: 0.14 K/uL — ABNORMAL HIGH (ref 0.00–0.07)
Basophils Absolute: 0.1 K/uL (ref 0.0–0.1)
Basophils Relative: 0 %
Eosinophils Absolute: 0.2 K/uL (ref 0.0–0.5)
Eosinophils Relative: 1 %
HCT: 38.1 % — ABNORMAL LOW (ref 39.0–52.0)
Hemoglobin: 12.6 g/dL — ABNORMAL LOW (ref 13.0–17.0)
Immature Granulocytes: 1 %
Lymphocytes Relative: 18 %
Lymphs Abs: 2.1 K/uL (ref 0.7–4.0)
MCH: 26.8 pg (ref 26.0–34.0)
MCHC: 33.1 g/dL (ref 30.0–36.0)
MCV: 81.1 fL (ref 80.0–100.0)
Monocytes Absolute: 0.7 K/uL (ref 0.1–1.0)
Monocytes Relative: 6 %
Neutro Abs: 8.4 K/uL — ABNORMAL HIGH (ref 1.7–7.7)
Neutrophils Relative %: 74 %
Platelets: 254 K/uL (ref 150–400)
RBC: 4.7 MIL/uL (ref 4.22–5.81)
RDW: 13.3 % (ref 11.5–15.5)
WBC: 11.5 K/uL — ABNORMAL HIGH (ref 4.0–10.5)
nRBC: 0 % (ref 0.0–0.2)

## 2024-09-10 LAB — BASIC METABOLIC PANEL WITH GFR
Anion gap: 8 (ref 5–15)
BUN: 9 mg/dL (ref 6–20)
CO2: 28 mmol/L (ref 22–32)
Calcium: 8.5 mg/dL — ABNORMAL LOW (ref 8.9–10.3)
Chloride: 105 mmol/L (ref 98–111)
Creatinine, Ser: 0.82 mg/dL (ref 0.61–1.24)
GFR, Estimated: >60 mL/min (ref >60)
Glucose, Bld: 114 mg/dL — ABNORMAL HIGH (ref 70–99)
Potassium: 3.6 mmol/L (ref 3.5–5.1)
Sodium: 140 mmol/L (ref 135–145)

## 2024-09-10 LAB — SYPHILIS: RPR W/REFLEX TO RPR TITER AND TREPONEMAL ANTIBODIES, TRADITIONAL SCREENING AND DIAGNOSIS ALGORITHM: RPR Ser Ql: NONREACTIVE

## 2024-09-10 MED ORDER — GADOBUTROL 1 MMOL/ML IV SOLN
7.0000 mL | Freq: Once | INTRAVENOUS | Status: AC | PRN
  Administered 2024-09-10: 06:00:00 7 mL via INTRAVENOUS

## 2024-09-10 MED ORDER — ACETAMINOPHEN 325 MG PO TABS
650.0000 mg | ORAL_TABLET | Freq: Four times a day (QID) | ORAL | 0 refills | Status: AC | PRN
  Filled 2024-09-10: qty 30, 4d supply, fill #0

## 2024-09-10 MED ORDER — AMOXICILLIN-POT CLAVULANATE 875-125 MG PO TABS
1.0000 | ORAL_TABLET | Freq: Two times a day (BID) | ORAL | Status: DC
  Administered 2024-09-10: 11:00:00 1 via ORAL
  Filled 2024-09-10: qty 1

## 2024-09-10 MED ORDER — AZITHROMYCIN 500 MG PO TABS
500.0000 mg | ORAL_TABLET | Freq: Every day | ORAL | 0 refills | Status: DC
  Filled 2024-09-10: qty 3, 3d supply, fill #0

## 2024-09-10 MED ORDER — ATORVASTATIN CALCIUM 20 MG PO TABS
20.0000 mg | ORAL_TABLET | Freq: Every day | ORAL | 1 refills | Status: DC
  Filled 2024-09-10: qty 30, 30d supply, fill #0

## 2024-09-10 MED ORDER — AMOXICILLIN-POT CLAVULANATE 875-125 MG PO TABS
1.0000 | ORAL_TABLET | Freq: Two times a day (BID) | ORAL | 0 refills | Status: DC
  Filled 2024-09-10: qty 7, 4d supply, fill #0

## 2024-09-10 MED ORDER — HEPATITIS A VACCINE 1440 EL U/ML IM SUSY
1.0000 mL | PREFILLED_SYRINGE | Freq: Once | INTRAMUSCULAR | Status: AC
  Administered 2024-09-10: 12:00:00 1 mL via INTRAMUSCULAR
  Filled 2024-09-10: qty 1

## 2024-09-10 MED ORDER — LIDOCAINE 5 % EX PTCH
1.0000 | MEDICATED_PATCH | CUTANEOUS | 0 refills | Status: AC
  Filled 2024-09-10: qty 30, 30d supply, fill #0

## 2024-09-10 MED ORDER — TAMSULOSIN HCL 0.4 MG PO CAPS
0.4000 mg | ORAL_CAPSULE | Freq: Every day | ORAL | 1 refills | Status: DC
  Filled 2024-09-10: qty 30, 30d supply, fill #0

## 2024-09-10 MED ORDER — HEPATITIS B VAC RECOMB ADJ 20 MCG/0.5ML IM SOSY
0.5000 mL | PREFILLED_SYRINGE | Freq: Once | INTRAMUSCULAR | Status: AC
  Administered 2024-09-10: 12:00:00 0.5 mL via INTRAMUSCULAR
  Filled 2024-09-10: qty 0.5

## 2024-09-10 MED ORDER — IBUPROFEN 200 MG PO TABS
600.0000 mg | ORAL_TABLET | Freq: Once | ORAL | Status: AC
  Administered 2024-09-10: 10:00:00 600 mg via ORAL
  Filled 2024-09-10: qty 3

## 2024-09-10 MED ORDER — PANTOPRAZOLE SODIUM 40 MG PO TBEC
40.0000 mg | DELAYED_RELEASE_TABLET | Freq: Every day | ORAL | 0 refills | Status: AC
  Filled 2024-09-10: qty 30, 30d supply, fill #0

## 2024-09-10 NOTE — Plan of Care (Signed)
  Problem: Activity: Goal: Ability to tolerate increased activity will improve Outcome: Adequate for Discharge   Problem: Clinical Measurements: Goal: Ability to maintain a body temperature in the normal range will improve Outcome: Adequate for Discharge   Problem: Respiratory: Goal: Ability to maintain adequate ventilation will improve Outcome: Adequate for Discharge Goal: Ability to maintain a clear airway will improve Outcome: Adequate for Discharge   Problem: Education: Goal: Knowledge of General Education information will improve Description: Including pain rating scale, medication(s)/side effects and non-pharmacologic comfort measures Outcome: Adequate for Discharge   Problem: Health Behavior/Discharge Planning: Goal: Ability to manage health-related needs will improve Outcome: Adequate for Discharge   Problem: Clinical Measurements: Goal: Ability to maintain clinical measurements within normal limits will improve Outcome: Adequate for Discharge Goal: Will remain free from infection Outcome: Adequate for Discharge Goal: Diagnostic test results will improve Outcome: Adequate for Discharge Goal: Respiratory complications will improve Outcome: Adequate for Discharge Goal: Cardiovascular complication will be avoided Outcome: Adequate for Discharge   Problem: Activity: Goal: Risk for activity intolerance will decrease Outcome: Adequate for Discharge   Problem: Nutrition: Goal: Adequate nutrition will be maintained Outcome: Adequate for Discharge   Problem: Coping: Goal: Level of anxiety will decrease Outcome: Adequate for Discharge   Problem: Elimination: Goal: Will not experience complications related to bowel motility Outcome: Adequate for Discharge Goal: Will not experience complications related to urinary retention Outcome: Adequate for Discharge   Problem: Pain Managment: Goal: General experience of comfort will improve and/or be controlled Outcome:  Adequate for Discharge   Problem: Safety: Goal: Ability to remain free from injury will improve Outcome: Adequate for Discharge   Problem: Skin Integrity: Goal: Risk for impaired skin integrity will decrease Outcome: Adequate for Discharge

## 2024-09-10 NOTE — TOC Initial Note (Signed)
 Transition of Care [facility]) - Initial/Assessment Note    Patient Details  Name: Marvin Mckee MRN: [id number] Date of Birth: 01/26/1966  [facility] CM/SW Contact:    Marval Gell, RN Phone Number: 09/10/2024, 9:39 AM  Clinical Narrative:                  Spoke with patient at bedside. He declines SA resources. He states that he lives at home alone, requested resources for food and utilities, and I have notified LCSW.  He states he has PCP at [facility] [place], will ask CMA to schedule follow up for next week.  He gets his meds through [facility] on [address]. We discussed getting meds filled through [facility] before DC and he is agreeable, MATCH in notes to send to [facility] pharmacy at time of DC   ICM will continue to follow   Expected Discharge Plan: Home/Self Care Barriers to Discharge: Continued Medical Work up   Patient Goals and CMS Choice Patient states their goals for this hospitalization and ongoing recovery are:: to return home          Expected Discharge Plan and Services In-house Referral: Clinical Social Work Discharge Planning Services: CM Consult   Living arrangements for the past 2 months: Single Family Home                 DME Arranged: N/A           HH Agency: NA        Prior Living Arrangements/Services Living arrangements for the past 2 months: Single Family Home Lives with:: Self                   Activities of Daily Living   ADL Screening (condition at time of admission) Independently performs ADLs?: Yes (appropriate for developmental age) Is the patient deaf or have difficulty hearing?: No Does the patient have difficulty seeing, even when wearing glasses/contacts?: No Does the patient have difficulty concentrating, remembering, or making decisions?: No  Permission Sought/Granted                  Emotional Assessment              Admission diagnosis:  CAP (community acquired pneumonia) [J18.9] Sepsis due to  pneumonia (HCC) [J18.9, A41.9] Multifocal pneumonia [J18.8] Patient Active Problem List   Diagnosis Date Noted   Aortic atherosclerosis 09/09/2024   Hyperlipidemia 09/09/2024   Leukocytosis 09/09/2024   Right renal mass 09/09/2024   Cocaine use 09/09/2024   Atherosclerosis 08/14/2022   Community acquired pneumonia of left lower lobe of lung 01/22/2022   Sepsis (HCC) 01/22/2022   History of renal cell cancer 12/29/2020   Low back pain radiating to left lower extremity 09/14/2020   Neck pain 09/14/2020   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, [address] [place] [address] Phone: [phone] Fax: [phone]  [facility] [address] [place] [address] Phone: [phone] Fax: [phone]     Social Drivers of Health (SDOH) Social History: SDOH Screenings   Food Insecurity: Food Insecurity Present (09/09/2024)  Housing: High Risk (09/09/2024)  Transportation Needs: Unmet Transportation Needs (09/09/2024)  Utilities:  At Risk (09/09/2024)  Depression (PHQ2-9): Medium Risk (08/14/2022)  Tobacco Use: High Risk (09/10/2024)   SDOH Interventions:     Readmission Risk Interventions     No data to display

## 2024-09-10 NOTE — Discharge Summary (Signed)
 Name: Marvin Mckee MRN: [id number] DOB: [birth year] 58 y.o. PCP: Delbert Clam, MD  Date of Admission: 09/09/2024  2:19 AM Date of Discharge: 09/10/2024 Attending Physician: Dr. Eben  Discharge Diagnosis: Principal Problem:   Community acquired pneumonia of left lower lobe of lung Active Problems:   HTN (hypertension)   Cocaine abuse (HCC)   History of renal cell cancer   Aortic atherosclerosis   Hyperlipidemia   Leukocytosis   Right renal mass   Cocaine use    Discharge Medications: Allergies as of 09/10/2024   No Known Allergies      Medication List     STOP taking these medications    benzonatate  200 MG capsule Commonly known as: TESSALON    cefdinir  300 MG capsule Commonly known as: OMNICEF        TAKE these medications    acetaminophen  325 MG tablet Commonly known as: TYLENOL  Take 2 tablets (650 mg total) by mouth every 6 (six) hours as needed for mild pain (pain score 1-3) or fever (or Fever >/= 101).   amoxicillin -clavulanate 875-125 MG tablet Commonly known as: AUGMENTIN  Take 1 tablet by mouth every 12 (twelve) hours.   atorvastatin  20 MG tablet Commonly known as: LIPITOR Take 1 tablet (20 mg total) by mouth daily.   azithromycin  500 MG tablet Commonly known as: ZITHROMAX  Take 1 tablet (500 mg total) by mouth daily. Start taking on: September 11, 2024   lidocaine  5 % Commonly known as: LIDODERM  Place 1 patch onto the skin daily. Remove & Discard patch within 12 hours or as directed by MD Start taking on: September 11, 2024   pantoprazole  40 MG tablet Commonly known as: PROTONIX  Take 1 tablet (40 mg total) by mouth daily.   tadalafil  10 MG tablet Commonly known as: CIALIS  Take 1 tablet (10 mg total) by mouth every other day as needed for erectile dysfunction.   tamsulosin  0.4 MG Caps capsule Commonly known as: FLOMAX  Take 1 capsule (0.4 mg total) by mouth daily.        Disposition and follow-up:   Mr.Elbie PHINEAS MCENROE  was discharged from [facility] in Stable condition.  At the hospital follow up visit please address:  1.  Follow-up:  a.  Ensure completion of Augmentin  course and assess for full resolution of respiratory symptoms    b. Ensure outpatient follow-up with Urology   c. Continue counseling on cessation of cocaine and tobacco use  4.  Medication Changes  Abx -  Augmentin  [id number] End Date: 09/14/2024  Follow-up Appointments:  Follow-up Information     [facility]. Schedule an appointment as soon as possible for a visit.   Why: Please make an appointment to be seen by the urologist.  There is some concern that you may have cancer of your right kidney. Contact information: [address] [place] [address] [place]  [address] [phone]        Delbert Clam, MD. Call in 1 week(s).   Specialty: Family Medicine Why: Call and make an appointment to see your PCP Contact information: [address] [address] [place] [address] [phone]                 Hospital Course by problem list: #Community-Acquired Pneumonia / Aspiration Pneumonia The patient presented with shortness of breath, productive cough, chest pain, fever, and leukocytosis. CT angiography of the chest demonstrated multifocal bilateral pneumonia, most prominent in the lower lobes and right middle lobe, with dependent tracheobronchial debris concerning  for aspiration. There was no evidence of pulmonary embolism. Respiratory viral panel was negative, and blood cultures showed no growth. MRSA nares were negative.  The patient was initially treated with broad-spectrum intravenous antibiotics (vancomycin , cefepime , and metronidazole ), which were de-escalated based on clinical improvement and negative infectious workup. He was transitioned to oral Augmentin , which he tolerated well. He remained hemodynamically stable throughout admission and did not require supplemental  oxygen.  #Leukocytosis Initial leukocytosis (WBC 18.1) was felt to be secondary to pneumonia. WBC count improved with antibiotic therapy and clinical stabilization.  #Right Renal Mass / History of Renal Cell Carcinoma CT abdomen and pelvis revealed interval enlargement of a hypodense mass in the lower pole of the right kidney, increasing from 1.3 cm to 2.1 cm, concerning for recurrent renal cell carcinoma given the patients prior history. MRI conducted to follow up with renal mass, which showed a mass 2.3 x 1.3cm. Urology consulted and will follow up with patient outpatient.    Other chronic conditions were medically managed with home medications and formulary alternatives as necessary (HTN, HLD, Cocaine use disorder, Tobacco use disorder, BPH, GERD).     Discharge Subjective: Patient found sitting up in bed in no acute distress. He states he wants to go home and no longer wants to be in the hospital. He was agreeable to start oral antibiotics and to follow up with urology outpatient. No complaints.   Discharge Exam:   BP (!) 172/117 (BP Location: Right Arm)   Pulse 96   Temp 98.7 F (37.1 C) (Oral)   Resp 18   Ht 6' 1 (1.854 m)   Wt 71.2 kg   SpO2 100%   BMI 20.71 kg/m  Constitutional: well-appearing sitting in bed, in no acute distress HENT: normocephalic atraumatic, mucous membranes moist Eyes: conjunctiva non-erythematous Neck: supple Cardiovascular: regular rate and rhythm, no m/r/g Pulmonary/Chest: normal work of breathing on room air, lungs clear to auscultation bilaterally Abdominal: soft, non-tender, non-distended MSK: normal bulk and tone Neurological: alert & oriented x 3, 5/5 strength in bilateral upper and lower extremities, normal gait Skin: warm and dry  Psych:  Cognition and judgment appear intact. Alert, communicative  and cooperative with normal attention span and concentration. No apparent delusions, illusions, hallucinations    Pertinent Labs, Studies,  and Procedures:     Latest Ref Rng & Units 09/10/2024    1:33 AM 09/09/2024    2:50 AM 08/03/2022    2:20 AM  CBC  WBC 4.0 - 10.5 K/uL 11.5  18.1  22.1   Hemoglobin 13.0 - 17.0 g/dL 87.3  85.7  86.7   Hematocrit 39.0 - 52.0 % 38.1  42.6  39.6   Platelets 150 - 400 K/uL 254  275  295        Latest Ref Rng & Units 09/10/2024    1:33 AM 09/09/2024   12:32 PM 09/09/2024    2:50 AM  CMP  Glucose 70 - 99 mg/dL 885   96   BUN 6 - 20 mg/dL 9   18   Creatinine 9.38 - 1.24 mg/dL 9.17   8.98   Sodium 864 - 145 mmol/L 140   136   Potassium 3.5 - 5.1 mmol/L 3.6   3.7   Chloride 98 - 111 mmol/L 105   101   CO2 22 - 32 mmol/L 28   25   Calcium  8.9 - 10.3 mg/dL 8.5   8.4   Total Protein 6.5 - 8.1 g/dL  6.6  Total Bilirubin 0.0 - 1.2 mg/dL  0.2    Alkaline Phos 38 - 126 U/L  89    AST 15 - 41 U/L  26    ALT 0 - 44 U/L  15      MR ABDOMEN W WO CONTRAST Result Date: 09/10/2024 EXAM: MRCP WITH AND WITHOUT IV CONTRAST 09/10/2024 05:56:00 AM TECHNIQUE: Multisequence, multiplanar magnetic resonance images of the abdomen with and without intravenous contrast. MRCP sequences were performed. 7 mL (gadobutrol  (GADAVIST ) 1 MMOL/ML injection 7 mL GADOBUTROL  1 MMOL/ML IV SOLN) was administered intravenously. COMPARISON: 09/09/2024 CLINICAL HISTORY: Concern for renal cell carcinoma of right kidney. FINDINGS: LIVER: A hemangioma is identified within the central liver adjacent to the IVC measuring 3.8 x 3.1 cm, image 15/3. A smaller hemangioma is noted within the medial segment of the left lobe of the liver measuring 1 cm, image 21/3. GALLBLADDER AND BILIARY SYSTEM: Normal decompressed gallbladder. No intrahepatic or extrahepatic ductal dilation. SPLEEN: Unremarkable. PANCREAS/PANCREATIC DUCT: Visualized pancreas is unremarkable. No pancreatic ductal dilatation. ADRENAL GLANDS: Unremarkable. KIDNEYS: Arising off the lower pole of the right kidney is a solid lesion measuring 2.3 x 1.9 x 2.3 cm, coronal image 9/2,  axial image 30/3. This shows restricted diffusion and enhancement on the postcontrast images. A small Bosniak class 1 cyst arises off the anterior cortex of the inferior pole of the right kidney, measuring 8 mm. LYMPH NODES: No enlarged abdominal lymph nodes. VASCULATURE: Unremarkable. PERITONEUM: No ascites. ABDOMINAL WALL: No hernia. No mass. BOWEL: Grossly unremarkable. No bowel obstruction. BONES: No acute abnormality or worrisome osseous lesion. SOFT TISSUES: Unremarkable. MISCELLANEOUS: Airspace disease identified within both lower lobes, compatible with multifocal pneumonia. IMPRESSION: 1. Solid enhancing right lower pole renal mass measuring 2.3 x 1.9 x 2.3 cm, consistent with renal cell carcinoma, recommend urology consultation and consider biopsy. Electronically signed by: Waddell Calk MD 09/10/2024 06:26 AM EST RP Workstation: [id number]   CT Angio Chest PE W and/or Wo Contrast Result Date: 09/09/2024 CLINICAL DATA:  Shortness of breath and productive cough 2 days with generalized abdominal pain and fever. History of left renal cell carcinoma post excision 2017. EXAM: CT ANGIOGRAPHY CHEST CT ABDOMEN AND PELVIS WITH CONTRAST TECHNIQUE: Multidetector CT imaging of the chest was performed using the standard protocol during bolus administration of intravenous contrast. Multiplanar CT image reconstructions and MIPs were obtained to evaluate the vascular anatomy. Multidetector CT imaging of the abdomen and pelvis was performed using the standard protocol during bolus administration of intravenous contrast. RADIATION DOSE REDUCTION: This exam was performed according to the departmental dose-optimization program which includes automated exposure control, adjustment of the mA and/or kV according to patient size and/or use of iterative reconstruction technique. CONTRAST:  75mL OMNIPAQUE  IOHEXOL  350 MG/ML SOLN COMPARISON:  Chest CT 07/06/2023, abdominopelvic CT 11/01/2022 and 08/03/2022 FINDINGS: CTA CHEST  FINDINGS Cardiovascular: Heart is normal size. Thoracic aorta is normal in caliber without aneurysm or dissection. 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 Pulmonary arterial system is well opacified without evidence of emboli. Remaining vascular structures are unremarkable. Mediastinum/Nodes: No significant mediastinal or hilar adenopathy. Remaining mediastinal structures are unremarkable. Lungs/Pleura: Lungs are adequately inflated. There is patchy bilateral airspace opacification most notable over the lower lobes and right middle lobe compatible multifocal pneumonia. No effusion. Airways are notable for minimal dependent debris within the trachea and left mainstem bronchus likely aspirate material as patient's multifocal infection may be due to aspiration pneumonia. Musculoskeletal: No focal abnormality. Review of the MIP images  confirms the above findings. CT ABDOMEN and PELVIS FINDINGS Hepatobiliary: Liver, gallbladder and biliary tree are normal. Pancreas: Normal. Spleen: Normal. Adrenals/Urinary Tract: Adrenal glands are normal. Kidneys are normal in size without hydronephrosis or nephrolithiasis. Tiny subcentimeter hypodensity over the anterior cortex lower pole right kidney unchanged and too small to characterize, but likely cyst. Interval enlargement of a hypodense mass over the lower pole right kidney measuring 2.1 cm (previously 1.3 cm). Ureters and bladder are unremarkable. Stomach/Bowel: Stomach and small bowel are unremarkable. Appendix is not visualized. Colon is unremarkable. Vascular/Lymphatic: Minimal calcified plaque over the abdominal aorta which is normal in caliber. Remaining vascular structures are unremarkable. No adenopathy. Reproductive: Prostate is unremarkable. Other: No free fluid or free peritoneal air. No focal inflammatory process. Musculoskeletal: Mild spondylosis of the lower lumbar spine with degenerative disc disease at the L5-S1 level.  Review of the MIP images confirms the above findings. IMPRESSION: 1. No evidence of pulmonary embolism. 2. Patchy bilateral airspace opacification most notable over the lower lobes and right middle lobe compatible with multifocal pneumonia. Minimal dependent debris within the trachea and left mainstem bronchus as patient's multifocal infection may be due to aspiration pneumonia. 3. No acute findings in the abdomen/pelvis. 4. Interval enlargement of a hypodense mass over the lower pole right kidney measuring 2.1 cm (previously 1.3 cm). This is indeterminate, but concerning for renal cell carcinoma. Recommend further evaluation with MRI of the abdomen with and without contrast. 5. Aortic atherosclerosis. Aortic Atherosclerosis (ICD10-I70.0). Electronically Signed   By: Toribio Agreste M.D.   On: 09/09/2024 09:01   CT ABDOMEN PELVIS W CONTRAST Result Date: 09/09/2024 CLINICAL DATA:  Shortness of breath and productive cough 2 days with generalized abdominal pain and fever. History of left renal cell carcinoma post excision 2017. EXAM: CT ANGIOGRAPHY CHEST CT ABDOMEN AND PELVIS WITH CONTRAST TECHNIQUE: Multidetector CT imaging of the chest was performed using the standard protocol during bolus administration of intravenous contrast. Multiplanar CT image reconstructions and MIPs were obtained to evaluate the vascular anatomy. Multidetector CT imaging of the abdomen and pelvis was performed using the standard protocol during bolus administration of intravenous contrast. RADIATION DOSE REDUCTION: This exam was performed according to the departmental dose-optimization program which includes automated exposure control, adjustment of the mA and/or kV according to patient size and/or use of iterative reconstruction technique. CONTRAST:  75mL OMNIPAQUE  IOHEXOL  350 MG/ML SOLN COMPARISON:  Chest CT 07/06/2023, abdominopelvic CT 11/01/2022 and 08/03/2022 FINDINGS: CTA CHEST FINDINGS Cardiovascular: Heart is normal size. Thoracic  aorta is normal in caliber without aneurysm or dissection. 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 Pulmonary arterial system is well opacified without evidence of emboli. Remaining vascular structures are unremarkable. Mediastinum/Nodes: No significant mediastinal or hilar adenopathy. Remaining mediastinal structures are unremarkable. Lungs/Pleura: Lungs are adequately inflated. There is patchy bilateral airspace opacification most notable over the lower lobes and right middle lobe compatible multifocal pneumonia. No effusion. Airways are notable for minimal dependent debris within the trachea and left mainstem bronchus likely aspirate material as patient's multifocal infection may be due to aspiration pneumonia. Musculoskeletal: No focal abnormality. Review of the MIP images confirms the above findings. CT ABDOMEN and PELVIS FINDINGS Hepatobiliary: Liver, gallbladder and biliary tree are normal. Pancreas: Normal. Spleen: Normal. Adrenals/Urinary Tract: Adrenal glands are normal. Kidneys are normal in size without hydronephrosis or nephrolithiasis. Tiny subcentimeter hypodensity over the anterior cortex lower pole right kidney unchanged and too small to characterize, but likely cyst. Interval enlargement of a hypodense  mass over the lower pole right kidney measuring 2.1 cm (previously 1.3 cm). Ureters and bladder are unremarkable. Stomach/Bowel: Stomach and small bowel are unremarkable. Appendix is not visualized. Colon is unremarkable. Vascular/Lymphatic: Minimal calcified plaque over the abdominal aorta which is normal in caliber. Remaining vascular structures are unremarkable. No adenopathy. Reproductive: Prostate is unremarkable. Other: No free fluid or free peritoneal air. No focal inflammatory process. Musculoskeletal: Mild spondylosis of the lower lumbar spine with degenerative disc disease at the L5-S1 level. Review of the MIP images confirms the above findings.  IMPRESSION: 1. No evidence of pulmonary embolism. 2. Patchy bilateral airspace opacification most notable over the lower lobes and right middle lobe compatible with multifocal pneumonia. Minimal dependent debris within the trachea and left mainstem bronchus as patient's multifocal infection may be due to aspiration pneumonia. 3. No acute findings in the abdomen/pelvis. 4. Interval enlargement of a hypodense mass over the lower pole right kidney measuring 2.1 cm (previously 1.3 cm). This is indeterminate, but concerning for renal cell carcinoma. Recommend further evaluation with MRI of the abdomen with and without contrast. 5. Aortic atherosclerosis. Aortic Atherosclerosis (ICD10-I70.0). Electronically Signed   By: Toribio Agreste M.D.   On: 09/09/2024 09:01   DG Chest 2 View Result Date: 09/09/2024 EXAM: 2 VIEW(S) XRAY OF THE CHEST 09/09/2024 03:13:00 AM COMPARISON: 08/03/2022 (plain film), CT 07/06/2023. CLINICAL HISTORY: shob FINDINGS: LUNGS AND PLEURA: Bibasilar airspace opacities are noted improved from prior plain film in 2023, but increased from prior CT examination in 2024. No pleural effusion. No pneumothorax. HEART AND MEDIASTINUM: No acute abnormality of the cardiac and mediastinal silhouettes. BONES AND SOFT TISSUES: No acute osseous abnormality. IMPRESSION: 1. Bibasilar airspace opacities, improved from prior plain film in 2023, but increased from prior CT examination in 2024. Electronically signed by: Oneil Devonshire MD 09/09/2024 03:21 AM EST RP Workstation: [id number]     Discharge Instructions: Discharge Instructions     Call MD for:  difficulty breathing, headache or visual disturbances   Complete by: As directed    Call MD for:  severe uncontrolled pain   Complete by: As directed    Increase activity slowly   Complete by: As directed        Signed: Elodie Palma, MD 09/10/2024, 1:06 PM   Internal Medicine Resident PGY-3

## 2024-09-10 NOTE — Discharge Instructions (Addendum)
 Dear Marvin Mckee,  Thank you for letting us  participate in your care. You were hospitalized for shortness of breath, cough, and fever and diagnosed with community-acquired pneumonia of the left lower lobe of the lung. You were treated with intravenous antibiotics and transitioned to oral antibiotics with improvement in your symptoms.  Other problems managed this admission included renal cell carcinoma, hypertension, hyperlipidemia, tobacco use, cocaine use, benign prostatic hyperplasia, and GERD.  MEDICATION CHANGES - Start amoxicillin -clavulanate (Augmentin ) 875-125 tablet: 1 tablet tonight followed by 1 tablet by mouth 2 times daily from 09/11/24-09/13/24--for pneumonia  -Azithromycin  500 mg: 1 tablet daily starting from tomorrow 09/11/24-09/13/24--for pneumonia  -Take lidocaine  patched as needed for pain every 12 hours -Take tylenol  325 mg: 2 tablets as needed for pain  POST-HOSPITAL & CARE INSTRUCTIONS Complete your full course of antibiotics even if you feel better Please call the number below without fail to follow up with urology concerning your renal cell carcinoma.  Please call to arrange follow up with your primary care physician in 1 week.  Go to your follow up appointments (listed below)   DOCTOR'S APPOINTMENT   No future appointments.  Follow-up Information     [facility]. Schedule an appointment as soon as possible for a visit.   Why: Please make an appointment to be seen by the urologist.  There is some concern that you may have cancer of your right kidney. Contact information: [address] [place] [address] [place]  [address] [phone]                Take care and be well!  Internal Medicine Teaching Service Inpatient Team   [facility]  [address] [place], [place] [address] [phone]

## 2024-09-10 NOTE — Care Management (Signed)
 M[employer]ICATION [employer]ANCE C[employer] Pharmacies please call: [phone] for claim processing assistance.  Rx BIN: [id number] Rx Group: [id number] Rx PCN: [id number] Relationship Code: 1 Person Code: 01  Patient ID (MRN): [id number]   Patient Name: Marvin Mckee    Patient DOB: Oct 21, 1965    Discharge Date: 09/10/2024    Expiration Date:09/18/2024 (must be filled within 7 days of discharge)  ICM- Please send to [facility] pharmacy at [place] if needed to cover medications

## 2024-09-11 ENCOUNTER — Telehealth: Payer: Self-pay | Admitting: Family Medicine

## 2024-09-11 ENCOUNTER — Telehealth: Payer: Self-pay

## 2024-09-11 LAB — GC/CHLAMYDIA PROBE AMP (~~LOC~~) NOT AT ARMC
Chlamydia: NEGATIVE
Comment: NEGATIVE
Comment: NORMAL
Neisseria Gonorrhea: NEGATIVE

## 2024-09-11 NOTE — Telephone Encounter (Signed)
 Attempted to contact patient, Patient did not answer. Left voicemail informing patient that we are not accepting new patients and provided callback information for any questions.

## 2024-09-11 NOTE — Telephone Encounter (Signed)
 Completed Disability application emailed to [facility]

## 2024-09-11 NOTE — Transitions of Care (Post Inpatient/ED Visit) (Signed)
 09/11/2024  Name: Marvin Mckee MRN: [id number] DOB: March 03, 1966  Today's TOC FU Call Status: Today's TOC FU Call Status:: Successful TOC FU Call Completed TOC FU Call Complete Date: 09/11/24  Patient's Name and Date of Birth confirmed. Name, DOB  Transition Care Management Follow-up Telephone Call Date of Discharge: 09/10/24 Discharge Facility: [facility]) Type of Discharge: Inpatient Admission Primary Inpatient Discharge Diagnosis:: CAP LLL How have you been since you were released from the hospital?: Same (He stated he feels the same as he did in the hospital, maybe a little better) Any questions or concerns?: Yes Patient Questions/Concerns:: He is currenlty uninsured and is not working and he has not applied for Medicaid.  I offered to refer him to the [facility] DSS Mediciad Eligibility caseworkers at [facility] and he was in agreeement. I sent the caseworkers an email requesting they reach out to the patient and I told him to expect a call from them and they would guide him regarding next steps for the application.  He also has no income and said he was planning to apply for disability but has not applied yet.  I offered to refer him to [facility] for assistance with submitting an application for disability and he was in agreement. Patient Questions/Concerns Addressed: Other: (referred to [facility] and to [facility] Eligibility case workers)  Items Reviewed: Did you receive and understand the discharge instructions provided?: Yes Medications obtained,verified, and reconciled?: Yes (Medications Reviewed) (He has all medications except the tadalafil  and he did not have any questions about the med regime) Any new allergies since your discharge?: No Dietary orders reviewed?: Yes Type of Diet Ordered:: heart healthy, low sodium Do you have support at home?: Yes People in Home [RPT]: alone Name of Support/Comfort Primary Source: He lives alone  but stated he has help if needed  Medications Reviewed Today: Medications Reviewed Today     Reviewed by Marvis Bradley, RN (Case Manager) on 09/11/24 at 1126  Med List Status: <None>   Medication Order Taking? Sig Documenting Provider Last Dose Status Informant  acetaminophen  (TYLENOL ) 325 MG tablet [id number]  Take 2 tablets (650 mg total) by mouth every 6 (six) hours as needed for mild pain (pain score 1-3) or fever (or Fever >/= 101). Edgardo Pontiff, DO  Active   amoxicillin -clavulanate (AUGMENTIN ) 875-125 MG tablet [id number]  Take 1 tablet by mouth every 12 (twelve) hours. Syeda, Raeeha, DO  Active   atorvastatin  (LIPITOR) 20 MG tablet [id number]  Take 1 tablet (20 mg total) by mouth daily. Syeda, Raeeha, DO  Active   azithromycin  (ZITHROMAX ) 500 MG tablet [id number]  Take 1 tablet (500 mg total) by mouth daily. Syeda, Raeeha, DO  Active   lidocaine  (LIDODERM ) 5 % [id number]  Place 1 patch onto the skin daily. Remove & Discard patch within 12 hours or as directed by MD Edgardo Pontiff, DO  Active   pantoprazole  (PROTONIX ) 40 MG tablet [id number]  Take 1 tablet (40 mg total) by mouth daily. Syeda, Raeeha, DO  Active   tadalafil  (CIALIS ) 10 MG tablet [id number] No Take 1 tablet (10 mg total) by mouth every other day as needed for erectile dysfunction.  Patient not taking: Reported on 09/09/2024   Newlin, Enobong, MD Not Taking Active Self  tamsulosin  (FLOMAX ) 0.4 MG CAPS capsule [id number]  Take 1 capsule (0.4 mg total) by mouth daily. Syeda, Raeeha, DO  Active             Home Care  and Equipment/Supplies: Were Home Health Services Ordered?: No Any new equipment or medical supplies ordered?: No  Functional Questionnaire: Do you need assistance with bathing/showering or dressing?: No Do you need assistance with meal preparation?: No Do you need assistance with eating?: No Do you have difficulty maintaining continence: No Do you need assistance with getting out of bed/getting out of a  chair/moving?: No Do you have difficulty managing or taking your medications?: No  Follow up appointments reviewed: PCP Follow-up appointment confirmed?: Yes Date of PCP follow-up appointment?: 10/10/24 Follow-up Provider: Bascom Borer, NP at [facility].  The patient stated he would like to see Dr Delbert again and have her as a PCP.  I explained that he needs a hospital follow up appointment and Dr Delbert did not have any openings in her schedule so he was scheduled at [facility].  I told him that he can go to our [facility] next week for the hospital follow up and then ask them to schedule him with Dr Newlin after that.  He was in agreement and I gave him the schedule for the [facility] next week and told him that he can always call this office if he wants to know where the MMU is on a given day. Specialist Hospital Follow-up appointment confirmed?: No Reason Specialist Follow-Up Not Confirmed: Patient has Specialist Provider Number and will Call for Appointment (He said he would call urology as soon as he got off the phone with me.  He said he has the AVS with [facility] phone number) Do you need transportation to your follow-up appointment?: No (He said sometimes he has a hard time getting a ride to his appointments,) Do you understand care options if your condition(s) worsen?: Yes-patient verbalized understanding    SIGNATURE Slater Diesel, RN

## 2024-09-11 NOTE — Telephone Encounter (Signed)
 Patient returned call. Informed patient of previous message regarding new patient appointment. [facility]. Patient acknowledged and agreed.

## 2024-09-11 NOTE — Telephone Encounter (Signed)
 Copied from CRM #[id number]. Topic: Appointments - Scheduling Inquiry for Clinic >> Sep 10, 2024  2:07 PM Nessti S wrote:  Reason for CRM: pt was discharged from hospital 09/10/24 and needs to schedule hospital follow up appt with pcp. No available appts until January but pt will need sooner appt. Please call back to schedule hospital follow up.   ----------------------------------------------------------------------- From previous Reason for Contact - Scheduling: Patient/patient representative is calling to schedule an appointment. Refer to attachments for appointment information.

## 2024-09-12 LAB — LEGIONELLA PNEUMOPHILA SEROGP 1 UR AG: L. pneumophila Serogp 1 Ur Ag: NEGATIVE

## 2024-09-14 LAB — CULTURE, BLOOD (ROUTINE X 2)
Culture: NO GROWTH
Special Requests: ADEQUATE

## 2024-09-15 LAB — CULTURE, BLOOD (ROUTINE X 2)
Culture: NO GROWTH
Special Requests: ADEQUATE

## 2024-09-16 ENCOUNTER — Encounter: Payer: Self-pay | Admitting: Physician Assistant

## 2024-09-16 ENCOUNTER — Other Ambulatory Visit: Payer: Self-pay

## 2024-09-16 ENCOUNTER — Ambulatory Visit: Payer: Self-pay | Admitting: Physician Assistant

## 2024-09-16 VITALS — BP 162/108 | HR 94 | Ht 73.0 in | Wt 162.0 lb

## 2024-09-16 DIAGNOSIS — J189 Pneumonia, unspecified organism: Secondary | ICD-10-CM

## 2024-09-16 DIAGNOSIS — N2889 Other specified disorders of kidney and ureter: Secondary | ICD-10-CM

## 2024-09-16 DIAGNOSIS — Z85528 Personal history of other malignant neoplasm of kidney: Secondary | ICD-10-CM

## 2024-09-16 MED ORDER — BENZONATATE 100 MG PO CAPS
100.0000 mg | ORAL_CAPSULE | Freq: Three times a day (TID) | ORAL | 0 refills | Status: DC | PRN
  Filled 2024-09-16: qty 20, 4d supply, fill #0

## 2024-09-16 NOTE — Patient Instructions (Addendum)
 VISIT SUMMARY:  You were seen today for persistent pain and respiratory symptoms following a recent hospitalization for aspiration pneumonia. You have ongoing body aches, soreness, and fever, but your severe shortness of breath has resolved. We also discussed your feelings of depression and your challenges in accessing specialty care and other resources.  YOUR PLAN:  - You should continue taking Augmentin  as prescribed. Please do hourly deep breathing exercises while you are awake. We have scheduled a follow-up appointment next week to check your progress after finishing the antibiotics. A prescription for cough medicine has been sent to the [facility] and [facility].  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 It is important to follow up with urology for ongoing management.  -DEPRESSIVE SYMPTOMS: You expressed feelings of being ready to die and a lack of purpose, which are signs of depression. Although you denied any intention to harm yourself, you acknowledged thoughts of being better off dead. We provided you with information on behavioral health urgent care for support if needed.  [facility] [address], [place], [place] [address] [phone] or [phone] Walk-in urgent care 24/7 for anyone  For [facility] ONLY New patient assessments and therapy walk-ins: Monday and Wednesday 8am-11am First and second Friday 1pm-5pm New patient psychiatry and medication management walk-ins: Mondays, Wednesdays, Thursdays, Fridays 8am-11am No psychiatry walk-ins on Tuesday    Aspiration Pneumonia, Adult  Aspiration pneumonia is an infection that occurs after you breathe a large amount of food, liquid, stomach acid, or saliva into your lungs (pulmonary aspiration). This can cause inflammation and infection in the lungs. It can also make you cough and make it hard to breathe.  This condition is serious and can be life-threatening. What are the causes? This condition may be caused by: Breathing the bacteria in food, liquid, stomach acid, or saliva into your lungs. Breathing material such as blood or a foreign body into your lungs. This can cause an infection even if the material does not originally have bacteria on it. What increases the risk? You are more likely to get aspiration pneumonia if you have a condition that makes it hard to breathe, swallow, cough, or gag, such as: A breathing disorder. This includes chronic obstructive pulmonary disease (COPD). A brain (neurologic) disorder. This may be a stroke, seizures, Parkinson's disease, dementia, amyotrophic lateral sclerosis (ALS), or a brain injury. Gastroesophageal reflux disease (GERD). Esophageal narrowing. This is a narrowing of the tube that carries food to the stomach. You may also be more likely to get this condition if: You are older than age 58 and frail. You are given anesthesia for a procedure. You drink too much alcohol and pass out. If you pass out and vomit, the vomit can be inhaled into your lungs. You take medicines to help you sleep. These include tranquilizers and sedatives. You take poor care of your mouth and teeth. You do not get enough nutrients (are malnourished). You have a weak disease-fighting system (immune system). What are the signs or symptoms? The main sign of this condition is an episode of choking or coughing while eating or drinking. Other symptoms may include: A cough that does not go away. Trouble breathing. This may include shortness of breath. It may also involve high-pitched whistling sounds when you breathe, most often when you breathe out (wheezing). Fever. Chest pain. Being more tired than usual (fatigue). This condition may also be silent.  This means that you may not have any symptoms. How is this diagnosed? This condition may be diagnosed based on a physical  exam. You may also have tests, such as: Blood tests. Chest X-ray. Sputum culture. This is when mucus (sputum) is taken from the lungs or from the tubes that carry air to the lungs (bronchi). The sputum is tested for bacteria. Oximetry. This is when a sensor or clip is put on an area such as a finger, earlobe, or toe. It measures the level of oxygen in your blood. Swallowing study. This test looks at how food is swallowed. It is done to see whether food goes into your windpipe (trachea) or esophagus. Bronchoscopy. This test uses a flexible tube (bronchoscope) to see inside your lungs. How is this treated? This condition may be treated with: Medicines. Antibiotics will be given to kill the bacteria that are causing the pneumonia. Other medicines may also be used to reduce fever, pain, or inflammation. Breathing assistance and oxygen therapy. You may need to be given oxygen, or you may need breathing support from a machine (ventilator). Thoracentesis. This is a procedure to remove fluid that has built up in the space between the linings of the chest wall and the lungs. Changes in diet. If you get this condition often, you may need to have a feeding tube placed. This can help give you the nutrients you need. Follow these instructions at home: Medicines Take over-the-counter and prescription medicines as told by your health care provider. Finish your antibiotics even if you start to feel better. Take cough medicine only if you are losing sleep. Cough medicine can stop your body from being able to remove mucus from your lungs. General instructions Follow instructions from your health care provider about what you may eat and drink. You may need to: Avoid certain food textures. Thicken your liquids. This can lower your risk of getting this condition again. Sleep in a semi-upright position at night. Try to sleep in a reclining chair. You may also place a few pillows under your head in bed. Do not use  any products that contain nicotine  or tobacco. These products include cigarettes, chewing tobacco, and vaping devices, such as e-cigarettes. If you need help quitting, ask your health care provider. Return to your normal activities as told by your health care provider. Ask your health care provider what activities are safe for you. Keep all follow-up visits. Your health care provider may need to see how you are healing. Contact a health care provider if: You have a fever. You cough or choke when you eat or drink. You keep having signs or symptoms of this condition. Get help right away if: You have trouble breathing that gets worse. You have chest pain. These symptoms may be an emergency. Get help right away. Call 911. Do not wait to see if symptoms will go away. Do not drive yourself to the hospital. This information is not intended to replace advice given to you by your health care provider. Make sure you discuss any questions you have with your health care provider. Document Revised: 02/22/2022 Document Reviewed: 02/22/2022 Elsevier Patient Education  2024 [employer].

## 2024-09-16 NOTE — Progress Notes (Signed)
 "  Established Patient Office Visit  Subjective   Patient ID: Marvin Mckee, male    DOB: 11/27/1965  Age: 58 y.o. MRN: [id number]  Chief Complaint  Patient presents with   Hospitalization Follow-up    Sepsis due to PNA   Referral    Needs urology referral   Discussed the use of AI scribe software for clinical note transcription with the patient, who gave verbal consent to proceed.  History of Present Illness   Marvin Mckee is a 58 year old male with a history of kidney cancer who presents with persistent pain and respiratory symptoms following a recent hospitalization for aspiration pneumonia.  He was recently hospitalized for aspiration pneumonia, treated with IV antibiotics, and discharged on Augmentin  after completing azithromycin  yesterday. Since discharge he has persistent lower back and shoulder pain that worsens with breathing. He feels the pain in his chest high and low.  He has had minimal overall improvement. His severe shortness of breath has resolved, but he still has body aches, soreness, and reports ongoing fever and chills. He has avoided ibuprofen  and other pain medications because he is worried about harming his kidneys.  He lives without power at home and has limited financial resources. He describes feeling ready to die and to rest in peace with deceased family, raising concern for passive suicidal ideation. He adamantly denies thoughts of self harm. He has narcolepsy with sudden sleep episodes that affect his daily activities and safety.  He is trying to obtain an appointment with a kidney specialist and is applying for Medicaid but faces difficulties related to his criminal record, with resulting problems accessing specialty care, utilities, and food assistance.      Past Medical History:  Diagnosis Date   Back pain    Cancer (HCC)    Cocaine abuse (HCC)    DDD (degenerative disc disease), lumbar    Hypertension    Social History    Socioeconomic History   Marital status: Single    Spouse name: Not on file   Number of children: Not on file   Years of education: Not on file   Highest education level: Not on file  Occupational History   Not on file  Tobacco Use   Smoking status: Every Day    Current packs/day: 1.00    Types: Cigarettes   Smokeless tobacco: Never  Substance and Sexual Activity   Alcohol use: Not Currently   Drug use: Yes    Types: Cocaine   Sexual activity: Not Currently  Other Topics Concern   Not on file  Social History Narrative   Will be moving into own place with girlfriend soon      Right handed      Caffeine - none      Exercise - some      Education - college some   Social Drivers of Health   Tobacco Use: High Risk (09/16/2024)   Patient History    Smoking Tobacco Use: Every Day    Smokeless Tobacco Use: Never    Passive Exposure: Not on file  Financial Resource Strain: Not on file  Food Insecurity: Food Insecurity Present (09/09/2024)   Epic    Worried About Programme Researcher, Broadcasting/film/video in the Last Year: Sometimes true    [employer] of Food in the Last Year: Sometimes true  Transportation Needs: Unmet Transportation Needs (09/09/2024)   Epic    Lack of Transportation (Medical): Yes    Lack of Transportation (Non-Medical): Yes  Physical Activity: Not on file  Stress: Not on file  Social Connections: Not on file  Intimate Partner Violence: Not At Risk (09/09/2024)   Epic    Fear of Current or Ex-Partner: No    Emotionally Abused: No    Physically Abused: No    Sexually Abused: No  Depression (PHQ2-9): High Risk (09/16/2024)   Depression (PHQ2-9)    PHQ-2 Score: 17  Alcohol Screen: Not on file  Housing: High Risk (09/09/2024)   Epic    Unable to Pay for Housing in the Last Year: Yes    Number of Times Moved in the Last Year: 0    Homeless in the Last Year: No  Utilities: At Risk (09/09/2024)   Epic    Threatened with loss of utilities: Yes  Health Literacy: Not on  file   Family History  Problem Relation Age of Onset   Cancer Mother    Cancer Father    Allergies[1]  Review of Systems  Constitutional:  Positive for chills and fever.  HENT:  Negative for congestion and sore throat.   Eyes: Negative.   Respiratory:  Positive for cough. Negative for sputum production and wheezing.   Cardiovascular:  Negative for chest pain.  Gastrointestinal: Negative.   Genitourinary: Negative.   Musculoskeletal: Negative.   Skin: Negative.   Neurological: Negative.   Endo/Heme/Allergies: Negative.       Objective:     BP (!) 162/108 (BP Location: Left Arm, Patient Position: Sitting)   Pulse 94   Ht 6' 1 (1.854 m)   Wt 162 lb (73.5 kg)   SpO2 98%   BMI 21.37 kg/m  BP Readings from Last 3 Encounters:  09/16/24 (!) 162/108  09/10/24 (!) 172/117  08/14/23 (!) 159/94   Wt Readings from Last 3 Encounters:  09/16/24 162 lb (73.5 kg)  09/09/24 157 lb (71.2 kg)  08/14/23 154 lb (69.9 kg)    Physical Exam Vitals and nursing note reviewed.    GENERAL: Alert, cooperative, well developed, no acute distress HEENT: Normocephalic, normal oropharynx, moist mucous membranes CHEST: Clear to auscultation bilaterally, no wheezes, rhonchi, or crackles CARDIOVASCULAR: Normal heart rate and rhythm, S1 and S2 normal without murmurs ABDOMEN: Soft, non-tender, non-distended, without organomegaly, normal bowel sounds EXTREMITIES: No cyanosis or edema NEUROLOGICAL: Cranial nerves grossly intact, moves all extremities without gross motor or sensory deficit   Assessment & Plan:   Problem List Items Addressed This Visit       Respiratory   Community acquired pneumonia of left lower lobe of lung - Primary   Relevant Medications   benzonatate  (TESSALON ) 100 MG capsule     Other   History of renal cell cancer   Right renal mass  Results Radiology Chest CT: Pulmonary infiltrates consistent with aspiration pneumonia  Assessment and Plan Community acquired  pneumonia Persistent pain in kidneys, lower back, and shoulders with no improvement. CT scan showed aspiration pneumonia. No fever or chills post-discharge. - Continue Augmentin  as prescribed. - Encouraged hourly deep breathing exercises while awake. - Scheduled follow-up next week to assess post-antibiotic progress. - Sent cough medicine prescription to [facility].  History of renal cell carcinoma with right renal mass  - Encouraged urgent follow-up with urology for ongoing management.  Depressive symptoms Expressed feelings of being ready to die and lack of purpose. Denied self-harm but acknowledged thoughts of being better off dead. - Provided information on behavioral health urgent care for support if needed.  Patient strongly encouraged  to reach out to the [facility] to help with applying for financial assistance.  Patient understands and agrees.  Red flags given for prompt reevaluation.  Patient given appointment to follow-up with Dr. Newlin at the end of January.   I have reviewed the patient's medical history (PMH, PSH, Social History, Family History, Medications, and allergies) , and have been updated if relevant. I spent 30 minutes reviewing chart and  face to face time with patient.   Return in about 1 week (around 09/23/2024) for With [facility].    Galilee Pierron S Mayers, PA-C     [1] No Known Allergies  "

## 2024-10-01 ENCOUNTER — Ambulatory Visit (INDEPENDENT_AMBULATORY_CARE_PROVIDER_SITE_OTHER): Payer: Self-pay | Admitting: Primary Care

## 2024-10-10 ENCOUNTER — Ambulatory Visit: Payer: Self-pay | Admitting: Nurse Practitioner

## 2024-10-22 ENCOUNTER — Telehealth: Payer: Self-pay | Admitting: Family Medicine

## 2024-10-22 NOTE — Telephone Encounter (Signed)
 Contacted pt to confirmed appt pt hung up!

## 2024-10-23 ENCOUNTER — Ambulatory Visit: Payer: Self-pay | Admitting: Family Medicine

## 2024-10-23 ENCOUNTER — Encounter: Payer: Self-pay | Admitting: Family Medicine

## 2024-10-23 ENCOUNTER — Other Ambulatory Visit: Payer: Self-pay

## 2024-10-23 VITALS — BP 127/93 | HR 70 | Temp 97.7°F | Ht 73.0 in | Wt 165.0 lb

## 2024-10-23 DIAGNOSIS — N528 Other male erectile dysfunction: Secondary | ICD-10-CM

## 2024-10-23 DIAGNOSIS — M5116 Intervertebral disc disorders with radiculopathy, lumbar region: Secondary | ICD-10-CM

## 2024-10-23 DIAGNOSIS — N401 Enlarged prostate with lower urinary tract symptoms: Secondary | ICD-10-CM

## 2024-10-23 DIAGNOSIS — I10 Essential (primary) hypertension: Secondary | ICD-10-CM

## 2024-10-23 DIAGNOSIS — G47411 Narcolepsy with cataplexy: Secondary | ICD-10-CM

## 2024-10-23 DIAGNOSIS — C641 Malignant neoplasm of right kidney, except renal pelvis: Secondary | ICD-10-CM

## 2024-10-23 DIAGNOSIS — Z1211 Encounter for screening for malignant neoplasm of colon: Secondary | ICD-10-CM

## 2024-10-23 DIAGNOSIS — I7090 Unspecified atherosclerosis: Secondary | ICD-10-CM

## 2024-10-23 DIAGNOSIS — M51362 Other intervertebral disc degeneration, lumbar region with discogenic back pain and lower extremity pain: Secondary | ICD-10-CM

## 2024-10-23 MED ORDER — PREDNISONE 20 MG PO TABS
20.0000 mg | ORAL_TABLET | Freq: Every day | ORAL | 0 refills | Status: AC
  Filled 2024-10-23: qty 5, 5d supply, fill #0

## 2024-10-23 MED ORDER — TAMSULOSIN HCL 0.4 MG PO CAPS
0.8000 mg | ORAL_CAPSULE | Freq: Every day | ORAL | 1 refills | Status: AC
  Filled 2024-10-23: qty 180, 90d supply, fill #0

## 2024-10-23 MED ORDER — KETOROLAC TROMETHAMINE 60 MG/2ML IM SOLN
60.0000 mg | Freq: Once | INTRAMUSCULAR | Status: AC
  Administered 2024-10-23: 60 mg via INTRAMUSCULAR

## 2024-10-23 MED ORDER — MELOXICAM 7.5 MG PO TABS
7.5000 mg | ORAL_TABLET | Freq: Every day | ORAL | 2 refills | Status: AC
  Filled 2024-10-23: qty 30, 30d supply, fill #0

## 2024-10-23 MED ORDER — ATORVASTATIN CALCIUM 20 MG PO TABS
20.0000 mg | ORAL_TABLET | Freq: Every day | ORAL | 1 refills | Status: AC
  Filled 2024-10-23: qty 90, 90d supply, fill #0

## 2024-10-23 MED ORDER — SILDENAFIL CITRATE 100 MG PO TABS
100.0000 mg | ORAL_TABLET | Freq: Every day | ORAL | 2 refills | Status: AC | PRN
  Filled 2024-10-23: qty 10, 30d supply, fill #0

## 2024-10-23 MED ORDER — AMLODIPINE BESYLATE 5 MG PO TABS
5.0000 mg | ORAL_TABLET | Freq: Every day | ORAL | 1 refills | Status: AC
  Filled 2024-10-23: qty 90, 90d supply, fill #0

## 2024-10-23 MED ORDER — TIZANIDINE HCL 4 MG PO TABS
4.0000 mg | ORAL_TABLET | Freq: Three times a day (TID) | ORAL | 1 refills | Status: AC | PRN
  Filled 2024-10-23: qty 60, 20d supply, fill #0
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
## 2024-10-23 NOTE — Patient Instructions (Signed)
 VISIT SUMMARY:  During your visit, we addressed your worsening back pain, kidney cancer follow-up, narcolepsy, high blood pressure, urinary issues, and erectile dysfunction. We also discussed general health maintenance.  YOUR PLAN:  -DEGENERATIVE DISC DISEASE OF LUMBAR SPINE WITH RADICULOPATHY: This condition involves the deterioration of the discs in your lower spine, causing chronic back pain and numbness. We have prescribed meloxicam  for pain, tizanidine  as a muscle relaxant, and prednisone  for inflammation. You are also referred to physical therapy for further management.  -RENAL CELL CARCINOMA OF RIGHT KIDNEY: This is a type of kidney cancer. You recently consulted with a urologist and are awaiting further communication regarding the timing of your surgery or cryotherapy. Continue to follow up with your urologist for surgical planning.  -PRIMARY HYPERTENSION: This is high blood pressure. Your blood pressure was elevated, possibly due to pain. We have prescribed amlodipine  5 mg to manage your blood pressure and repeated the measurement in the clinic. Please report any symptoms of lightheadedness.  -PRIMARY NARCOLEPSY WITH CATAPLEXY: This is a sleep disorder that causes excessive daytime sleepiness and sudden muscle weakness. We provided an updated letter stating your inability to work due to narcolepsy.  -OTHER MALE ERECTILE DYSFUNCTION: This is the inability to maintain an erection. Since Cialis  was ineffective, we have prescribed Viagra  as an alternative.  -OTHER URINARY INCONTINENCE: This involves difficulty with urination, including a slow stream and urgency. We have increased your tamsulosin  dosage to 0.8 mg to see if it improves your symptoms.  -GENERAL HEALTH MAINTENANCE: You are due for colon cancer screening. We have ordered a stool test for this purpose.  INSTRUCTIONS:  Please follow up with your urologist regarding the timing of your kidney surgery or cryotherapy. Continue taking  your prescribed medications and report any new or worsening symptoms. Attend physical therapy sessions for your back pain. Complete the stool test for colon cancer screening as ordered.

## 2024-10-30 ENCOUNTER — Ambulatory Visit: Payer: Self-pay

## 2024-10-30 ENCOUNTER — Other Ambulatory Visit: Payer: Self-pay

## 2024-11-03 ENCOUNTER — Ambulatory Visit: Payer: Self-pay | Admitting: Physical Therapy
